# Patient Record
Sex: Female | Born: 1975 | Race: White | Hispanic: No | Marital: Married | Smoking: Never smoker
Health system: Southern US, Community
[De-identification: ages and names within clinical notes are randomized; demographics above are authoritative.]

## PROBLEM LIST (undated history)

## (undated) DIAGNOSIS — F909 Attention-deficit hyperactivity disorder, unspecified type: Secondary | ICD-10-CM

## (undated) DIAGNOSIS — I495 Sick sinus syndrome: Secondary | ICD-10-CM

## (undated) DIAGNOSIS — I4892 Unspecified atrial flutter: Secondary | ICD-10-CM

## (undated) DIAGNOSIS — B019 Varicella without complication: Secondary | ICD-10-CM

## (undated) DIAGNOSIS — I499 Cardiac arrhythmia, unspecified: Secondary | ICD-10-CM

## (undated) DIAGNOSIS — I2510 Atherosclerotic heart disease of native coronary artery without angina pectoris: Secondary | ICD-10-CM

## (undated) HISTORY — PX: ARTHROSCOPIC REPAIR ACL: SUR80

## (undated) HISTORY — DX: Unspecified atrial flutter: I48.92

## (undated) HISTORY — DX: Varicella without complication: B01.9

## (undated) HISTORY — DX: Attention-deficit hyperactivity disorder, unspecified type: F90.9

## (undated) HISTORY — DX: Cardiac arrhythmia, unspecified: I49.9

## (undated) HISTORY — DX: Sick sinus syndrome: I49.5

## (undated) HISTORY — PX: CARDIAC ELECTROPHYSIOLOGY STUDY AND ABLATION: SHX1294

## (undated) HISTORY — PX: INSERT / REPLACE / REMOVE PACEMAKER: SUR710

## (undated) HISTORY — PX: BILATERAL CARPAL TUNNEL RELEASE: SHX6508

## (undated) HISTORY — PX: LIPOMA EXCISION: SHX5283

---
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
## 1994-08-09 HISTORY — PX: ARTHROSCOPIC REPAIR ACL: SUR80

## 1999-02-07 DIAGNOSIS — Z950 Presence of cardiac pacemaker: Secondary | ICD-10-CM

## 1999-02-07 HISTORY — DX: Presence of cardiac pacemaker: Z95.0

## 1999-02-07 HISTORY — PX: INSERT / REPLACE / REMOVE PACEMAKER: SUR710

## 1999-08-10 HISTORY — PX: LIPOMA EXCISION: SHX5283

## 2002-08-09 HISTORY — PX: HYSTEROSCOPY: SHX211

## 2005-08-09 HISTORY — PX: BILATERAL CARPAL TUNNEL RELEASE: SHX6508

## 2018-03-06 DIAGNOSIS — M25562 Pain in left knee: Secondary | ICD-10-CM

## 2018-03-06 HISTORY — DX: Pain in left knee: M25.562

## 2020-06-30 ENCOUNTER — Telehealth: Payer: Self-pay | Admitting: Cardiology

## 2020-06-30 DIAGNOSIS — Z950 Presence of cardiac pacemaker: Secondary | ICD-10-CM

## 2020-06-30 DIAGNOSIS — I519 Heart disease, unspecified: Secondary | ICD-10-CM

## 2020-06-30 DIAGNOSIS — F909 Attention-deficit hyperactivity disorder, unspecified type: Secondary | ICD-10-CM | POA: Insufficient documentation

## 2020-06-30 HISTORY — DX: Presence of cardiac pacemaker: Z95.0

## 2020-06-30 HISTORY — DX: Heart disease, unspecified: I51.9

## 2020-06-30 NOTE — Telephone Encounter (Addendum)
   [facility] Pre-operative Risk Assessment    HEARTCARE STAFF: - Please ensure there is not already an duplicate clearance open for this procedure. - Under Visit Info/Reason for Call, type in Other and utilize the format Clearance MM/DD/YY or Clearance TBD. Do not use dashes or single digits. - If request is for dental extraction, please clarify the # of teeth to be extracted.  Request for surgical clearance:  1. What type of surgery is being performed? L total Knee Arthroplasty   2. When is this surgery scheduled? TBD   3. What type of clearance is required (medical clearance vs. Pharmacy clearance to hold med vs. Both)?  BOTH Also noted has st jude device   4. Are there any medications that need to be held prior to surgery and how long? Please advise   5. Practice name and name of physician performing surgery? [facility] Dr. Skip Estimable   6. What is the office phone number?  [phone]    7.   What is the office fax number? [phone]  8.   Anesthesia type (None, local, MAC, general) ?  Not noted    Clarisse Gouge 06/30/2020, 9:10 AM  _________________________________________________________________   (provider comments below)

## 2020-06-30 NOTE — Telephone Encounter (Signed)
Patient has not ever been seen by our office. She has a New Patient visit with Dr. Azucena Cecil tomorrow for pre-op evaluation. Therefore, will route clearance form to him so this is available at tomorrow's visit and will remove from pre-op pool.  Corrin Parker, PA-C 06/30/2020 10:57 AM

## 2020-07-01 ENCOUNTER — Ambulatory Visit (INDEPENDENT_AMBULATORY_CARE_PROVIDER_SITE_OTHER): Payer: 59 | Admitting: Cardiology

## 2020-07-01 ENCOUNTER — Encounter: Payer: Self-pay | Admitting: Cardiology

## 2020-07-01 ENCOUNTER — Other Ambulatory Visit: Payer: Self-pay

## 2020-07-01 VITALS — BP 108/76 | HR 70 | Ht 61.0 in | Wt 161.0 lb

## 2020-07-01 DIAGNOSIS — I471 Supraventricular tachycardia: Secondary | ICD-10-CM | POA: Diagnosis not present

## 2020-07-01 DIAGNOSIS — Z01818 Encounter for other preprocedural examination: Secondary | ICD-10-CM

## 2020-07-01 DIAGNOSIS — Z950 Presence of cardiac pacemaker: Secondary | ICD-10-CM | POA: Diagnosis not present

## 2020-07-01 MED ORDER — FLECAINIDE ACETATE 50 MG PO TABS: 50.0000 mg | ORAL_TABLET | Freq: Two times a day (BID) | ORAL | 5 refills | Status: DC

## 2020-07-01 MED ORDER — METOPROLOL SUCCINATE ER 25 MG PO TB24
25.0000 mg | ORAL_TABLET | Freq: Every day | ORAL | 5 refills | Status: DC
Start: 2020-07-01 — End: 2021-01-26

## 2020-07-01 NOTE — Progress Notes (Signed)
Cardiology Office Note:    Date:  07/01/2020   ID:  Emma Long, DOB 02-11-1976, MRN [id number]  PCP:  Patient, No Pcp Per  [facility] Cardiologist:  Debbe Odea, MD  [facility] Electrophysiologist:  None   Referring MD: No ref. provider found   Chief Complaint  Patient presents with  . New Patient (Initial Visit)    Establish care with provider for surgical clearance for total knee replacement. Medications verbally reviewed with patient.     History of Present Illness:    Emma Long is a 44 y.o. female with a hx of ADHD, atrial arrhythmias (parox SVT, afib s/p ablation 1997-1999), permanent pacemaker (st jude's 2000) who presents for preop eval.  Patient had ACL tear about 25 years ago. Underwent left knee surgery at the time. Subsequently developed left knee pain, steroid injections, gel injections have been tried without improvement in her symptoms. States having significant loss of cartilage with bone-on-bone contact. Left knee replacement is being planned.  She was diagnosed with SVT back in 1997. Antiarrhythmics were tried without improvement in symptoms. Subsequently underwent ablation procedure at [facility]. She developed recurrent atrial arrhythmias including atrial fibrillation and atrial flutter. She underwent 3 more ablation procedures between 97 and 99, started on flecainide and beta-blocker. She subsequently developed scar tissue in the atrium affecting her sinus node, leading to significant bradycardia. Permanent pacemaker was subsequently placed.  She is continue flecainide and beta-blocker sings, denies palpitations, denies chest pain or shortness of breath at rest or with exertion. Mobility is limited by her left knee pain. Denies any history of coronary artery disease.  Past Medical History:  Diagnosis Date  . ADHD   . Chicken pox   . Heart disease 06/30/2020  . Pacemaker 06/30/2020  . Pain in left knee 03/06/2018    Past Surgical History:   Procedure Laterality Date  . ARTHROSCOPIC REPAIR ACL     Left   . BILATERAL CARPAL TUNNEL RELEASE    . INSERT / REPLACE / REMOVE PACEMAKER    . LIPOMA EXCISION      Current Medications: Current Meds  Medication Sig  . amphetamine-dextroamphetamine (ADDERALL) 20 MG tablet 20 mg 2 (two) times daily  . ascorbic acid (VITAMIN C) 1000 MG tablet Take by mouth.  . ergocalciferol (VITAMIN D2) 1.25 MG (50000 UT) capsule ergocalciferol (vitamin D2) 1,250 mcg (50,000 unit) capsule  . flecainide (TAMBOCOR) 50 MG tablet Take 1 tablet (50 mg total) by mouth 2 (two) times daily.  Marland Kitchen lidocaine-prilocaine (EMLA) cream lidocaine-prilocaine 2.5 %-2.5 % topical cream  . metoprolol succinate (TOPROL-XL) 25 MG 24 hr tablet Take 1 tablet (25 mg total) by mouth daily.  Marland Kitchen VYVANSE 70 MG capsule Take 70 mg by mouth every morning.  . zinc gluconate 50 MG tablet Take 1 tablet by mouth daily.  . [DISCONTINUED] flecainide (TAMBOCOR) 50 MG tablet Take 50 mg by mouth 2 (two) times daily.  . [DISCONTINUED] metoprolol succinate (TOPROL-XL) 25 MG 24 hr tablet Take 25 mg by mouth daily.     Allergies:   Iodinated diagnostic agents and Other   Social History   Socioeconomic History  . Marital status: Married    Spouse name: Not on file  . Number of children: Not on file  . Years of education: Not on file  . Highest education level: Not on file  Occupational History  . Not on file  Tobacco Use  . Smoking status: Never Smoker  . Smokeless tobacco: Never Used  Substance and  Sexual Activity  . Alcohol use: Never  . Drug use: Never  . Sexual activity: Not on file  Other Topics Concern  . Not on file  Social History Narrative  . Not on file   Social Determinants of Health   Financial Resource Strain:   . Difficulty of Paying Living Expenses: Not on file  Food Insecurity:   . Worried About Programme researcher, broadcasting/film/video in the Last Year: Not on file  . Ran Out of Food in the Last Year: Not on file  Transportation  Needs:   . Lack of Transportation (Medical): Not on file  . Lack of Transportation (Non-Medical): Not on file  Physical Activity:   . Days of Exercise per Week: Not on file  . Minutes of Exercise per Session: Not on file  Stress:   . Feeling of Stress : Not on file  Social Connections:   . Frequency of Communication with Friends and Family: Not on file  . Frequency of Social Gatherings with Friends and Family: Not on file  . Attends Religious Services: Not on file  . Active Member of Clubs or Organizations: Not on file  . Attends Banker Meetings: Not on file  . Marital Status: Not on file     Family History: The patient's family history includes Breast cancer in her mother; Hypertension in her father and mother; Ovarian cysts in her sister; Parkinson's disease in her father; Prostate cancer in her father.  ROS:   Please see the history of present illness.     All other systems reviewed and are negative.  EKGs/Labs/Other Studies Reviewed:    The following studies were reviewed today:   EKG:  EKG is  ordered today.  The ekg ordered today demonstrates A- V paced rhythm  Recent Labs: No results found for requested labs within last 8760 hours.  Recent Lipid Panel No results found for: CHOL, TRIG, HDL, CHOLHDL, VLDL, LDLCALC, LDLDIRECT   Risk Assessment/Calculations:      Physical Exam:    VS:  BP 108/76 (BP Location: Right Arm, Patient Position: Sitting, Cuff Size: Normal)   Pulse 70   Ht 5\' 1"  (1.549 m)   Wt 161 lb (73 kg)   SpO2 99%   BMI 30.42 kg/m     Wt Readings from Last 3 Encounters:  07/01/20 161 lb (73 kg)     GEN:  Well nourished, well developed in no acute distress HEENT: Normal NECK: No JVD; No carotid bruits LYMPHATICS: No lymphadenopathy CARDIAC: RRR, no murmurs, rubs, gallops RESPIRATORY:  Clear to auscultation without rales, wheezing or rhonchi  ABDOMEN: Soft, non-tender, non-distended MUSCULOSKELETAL:  No edema; No deformity   SKIN: Warm and dry NEUROLOGIC:  Alert and oriented x 3 PSYCHIATRIC:  Normal affect   ASSESSMENT:    1. Pre-op evaluation   2. Pacemaker   3. Paroxysmal SVT (supraventricular tachycardia) (HCC)    PLAN:    In order of problems listed above:  1. Left knee replacement is being planned. Patient denies any symptoms of chest pain or shortness of breath. Has no history of CAD. Did tolerate a prior knee procedure. Will get echocardiogram to evaluate overall cardiac function. If echo is normal, patient can proceed with procedure at acceptable cardiac risk. 2. Permanent pacemaker, SA node dysfunction. Keep appointment with device clinic as scheduled. Will likely need [employer] device rep at time of procedure to program pacemaker to asynchronous mode. 3. History of atrial arrhythmia including A. fib, flutter, SVT status  post ablation. Continue Toprol-XL and flecainide  Follow-up in 1 year..    Shared Decision Making/Informed Consent       Medication Adjustments/Labs and Tests Ordered: Current medicines are reviewed at length with the patient today.  Concerns regarding medicines are outlined above.  Orders Placed This Encounter  Procedures  . EKG 12-Lead  . ECHOCARDIOGRAM COMPLETE   Meds ordered this encounter  Medications  . flecainide (TAMBOCOR) 50 MG tablet    Sig: Take 1 tablet (50 mg total) by mouth 2 (two) times daily.    Dispense:  60 tablet    Refill:  5  . metoprolol succinate (TOPROL-XL) 25 MG 24 hr tablet    Sig: Take 1 tablet (25 mg total) by mouth daily.    Dispense:  30 tablet    Refill:  5    Patient Instructions  Medication Instructions:   Your physician recommends that you continue on your current medications as directed. Please refer to the Current Medication list given to you today.  *If you need a refill on your cardiac medications before your next appointment, please call your pharmacy*   Lab Work: None Ordered If you have labs (blood work) drawn  today and your tests are completely normal, you will receive your results only by: Marland Kitchen MyChart Message (if you have MyChart) OR . A paper copy in the mail If you have any lab test that is abnormal or we need to change your treatment, we will call you to review the results.   Testing/Procedures:  1. Your physician has requested that you have an echocardiogram. Echocardiography is a painless test that uses sound waves to create images of your heart. It provides your doctor with information about the size and shape of your heart and how well your heart's chambers and valves are working. This procedure takes approximately one hour. There are no restrictions for this procedure.   Follow-Up: At [facility], you and your health needs are our priority.  As part of our continuing mission to provide you with exceptional heart care, we have created designated Provider Care Teams.  These Care Teams include your primary Cardiologist (physician) and Advanced Practice Providers (APPs -  Physician Assistants and Nurse Practitioners) who all work together to provide you with the care you need, when you need it.  We recommend signing up for the patient portal called "MyChart".  Sign up information is provided on this After Visit Summary.  MyChart is used to connect with patients for Virtual Visits (Telemedicine).  Patients are able to view lab/test results, encounter notes, upcoming appointments, etc.  Non-urgent messages can be sent to your provider as well.   To learn more about what you can do with MyChart, go to [email or web address].    Your next appointment:   1 year(s)  The format for your next appointment:   In Person  Provider:   Debbe Odea, MD   Other Instructions      Signed, Debbe Odea, MD  07/01/2020 10:25 AM    [facility]

## 2020-07-01 NOTE — Patient Instructions (Signed)
Medication Instructions:   Your physician recommends that you continue on your current medications as directed. Please refer to the Current Medication list given to you today.  *If you need a refill on your cardiac medications before your next appointment, please call your pharmacy*   Lab Work: None Ordered If you have labs (blood work) drawn today and your tests are completely normal, you will receive your results only by:  MyChart Message (if you have MyChart) OR  A paper copy in the mail If you have any lab test that is abnormal or we need to change your treatment, we will call you to review the results.   Testing/Procedures:  1. Your physician has requested that you have an echocardiogram. Echocardiography is a painless test that uses sound waves to create images of your heart. It provides your doctor with information about the size and shape of your heart and how well your hearts chambers and valves are working. This procedure takes approximately one hour. There are no restrictions for this procedure.   Follow-Up: At [facility], you and your health needs are our priority.  As part of our continuing mission to provide you with exceptional heart care, we have created designated Provider Care Teams.  These Care Teams include your primary Cardiologist (physician) and Advanced Practice Providers (APPs -  Physician Assistants and Nurse Practitioners) who all work together to provide you with the care you need, when you need it.  We recommend signing up for the patient portal called "MyChart".  Sign up information is provided on this After Visit Summary.  MyChart is used to connect with patients for Virtual Visits (Telemedicine).  Patients are able to view lab/test results, encounter notes, upcoming appointments, etc.  Non-urgent messages can be sent to your provider as well.   To learn more about what you can do with MyChart, go to [email or web address].    Your next appointment:    1 year(s)  The format for your next appointment:   In Person  Provider:   Debbe Odea, MD   Other Instructions

## 2020-07-07 ENCOUNTER — Encounter: Payer: Self-pay | Admitting: *Deleted

## 2020-07-08 ENCOUNTER — Other Ambulatory Visit: Payer: Self-pay

## 2020-07-08 ENCOUNTER — Ambulatory Visit
Admission: RE | Admit: 2020-07-08 | Discharge: 2020-07-08 | Disposition: A | Discharge status: Home / Self Care | Payer: 59 | Source: Ambulatory Visit | Attending: Cardiology | Admitting: Cardiology

## 2020-07-08 DIAGNOSIS — I110 Hypertensive heart disease with heart failure: Secondary | ICD-10-CM | POA: Diagnosis not present

## 2020-07-08 DIAGNOSIS — I071 Rheumatic tricuspid insufficiency: Secondary | ICD-10-CM | POA: Insufficient documentation

## 2020-07-08 DIAGNOSIS — Z950 Presence of cardiac pacemaker: Secondary | ICD-10-CM | POA: Insufficient documentation

## 2020-07-08 DIAGNOSIS — E785 Hyperlipidemia, unspecified: Secondary | ICD-10-CM | POA: Insufficient documentation

## 2020-07-08 DIAGNOSIS — E119 Type 2 diabetes mellitus without complications: Secondary | ICD-10-CM | POA: Insufficient documentation

## 2020-07-08 DIAGNOSIS — I509 Heart failure, unspecified: Secondary | ICD-10-CM | POA: Insufficient documentation

## 2020-07-08 DIAGNOSIS — I471 Supraventricular tachycardia: Secondary | ICD-10-CM | POA: Insufficient documentation

## 2020-07-08 LAB — ECHOCARDIOGRAM COMPLETE
AR max vel: 2.59 cm2
AV Area VTI: 2.58 cm2
AV Area mean vel: 2.58 cm2
AV Mean grad: 7 mmHg
AV Peak grad: 11.7 mmHg
Ao pk vel: 1.71 m/s
Area-P 1/2: 3.42 cm2
S' Lateral: 2.69 cm

## 2020-07-08 NOTE — Progress Notes (Signed)
*  PRELIMINARY RESULTS* Echocardiogram 2D Echocardiogram has been performed.  Emma Long Emma Long 07/08/2020, 10:42 AM

## 2020-07-15 ENCOUNTER — Telehealth: Payer: Self-pay | Admitting: Cardiology

## 2020-07-15 NOTE — Telephone Encounter (Addendum)
   Primary Cardiologist: Debbe Odea, MD  Chart reviewed and patient contacted today by phone as part of pre-operative protocol coverage. Pt seen by Dr Azucena Cecil 07/01/2020 for pre op clearance.  Echo done 07/08/2020  Showed normal LVF. She has had no new complaints.  Given past medical history and time since last visit, based on ACC/AHA guidelines, Emma Long would be at acceptable risk for the planned procedure without further cardiovascular testing.   The patient was advised that if she develops new symptoms prior to surgery to contact our office to arrange for a follow-up visit, and she verbalized understanding.  I will route this recommendation to the requesting party via Epic fax function and remove from pre-op pool.  Please call with questions.  Corine Shelter, PA-C 07/15/2020, 1:21 PM

## 2020-07-15 NOTE — Telephone Encounter (Signed)
   [facility] Pre-operative Risk Assessment    HEARTCARE STAFF: - Please ensure there is not already an duplicate clearance open for this procedure. - Under Visit Info/Reason for Call, type in Other and utilize the format Clearance MM/DD/YY or Clearance TBD. Do not use dashes or single digits. - If request is for dental extraction, please clarify the # of teeth to be extracted.  Request for surgical clearance:  1. What type of surgery is being performed? L total Knee Arthroplasty   2. When is this surgery scheduled? TBD   3. What type of clearance is required (medical clearance vs. Pharmacy clearance to hold med vs. Both)?  BOTH Also noted has st jude device   4. Are there any medications that need to be held prior to surgery and how long? Please advise   5. Practice name and name of physician performing surgery? [facility] Dr. Skip Estimable   6. What is the office phone number?  [phone]    7.   What is the office fax number? [phone]  8.   Anesthesia type (None, local, MAC, general) ?  Not noted    Marykay Lex 07/15/2020, 11:04 AM  _________________________________________________________________   (provider comments below)

## 2020-07-28 ENCOUNTER — Ambulatory Visit (INDEPENDENT_AMBULATORY_CARE_PROVIDER_SITE_OTHER): Payer: 59 | Admitting: Family Medicine

## 2020-07-28 ENCOUNTER — Encounter: Payer: Self-pay | Admitting: Family Medicine

## 2020-07-28 ENCOUNTER — Other Ambulatory Visit (HOSPITAL_COMMUNITY)
Admission: RE | Admit: 2020-07-28 | Discharge: 2020-07-28 | Disposition: A | Discharge status: Home / Self Care | Payer: 59 | Source: Ambulatory Visit | Attending: Family Medicine | Admitting: Family Medicine

## 2020-07-28 ENCOUNTER — Other Ambulatory Visit: Payer: Self-pay

## 2020-07-28 DIAGNOSIS — Z23 Encounter for immunization: Secondary | ICD-10-CM | POA: Diagnosis not present

## 2020-07-28 DIAGNOSIS — Z124 Encounter for screening for malignant neoplasm of cervix: Secondary | ICD-10-CM | POA: Diagnosis not present

## 2020-07-28 DIAGNOSIS — Z01419 Encounter for gynecological examination (general) (routine) without abnormal findings: Secondary | ICD-10-CM | POA: Diagnosis not present

## 2020-07-28 NOTE — Progress Notes (Signed)
Starting to get irregular periods

## 2020-07-28 NOTE — Progress Notes (Signed)
  Subjective:     Emma Long is a 44 y.o. female and is here for a comprehensive physical exam. The patient reports no problems. Her cycles have gotten somewhat irregular with her skipping a few months between them. Has long h/o hot flashes.    The following portions of the patient's history were reviewed and updated as appropriate: allergies, current medications, past family history, past medical history, past social history, past surgical history and problem list.  Review of Systems Pertinent items are noted in HPI.   Objective:    BP 125/80   Pulse 77   Ht 5\' 1"  (1.549 m)   Wt 164 lb (74.4 kg)   BMI 30.99 kg/m  General appearance: alert, cooperative and appears stated age Head: Normocephalic, without obvious abnormality, atraumatic Neck: no adenopathy, supple, symmetrical, trachea midline and thyroid not enlarged, symmetric, no tenderness/mass/nodules Lungs: clear to auscultation bilaterally Breasts: normal appearance, no masses or tenderness Heart: regular rate and rhythm, S1, S2 normal, no murmur, click, rub or gallop Abdomen: soft, non-tender; bowel sounds normal; no masses,  no organomegaly Pelvic: cervix normal in appearance, external genitalia normal, no adnexal masses or tenderness, no cervical motion tenderness, uterus normal size, shape, and consistency and vagina normal without discharge Extremities: extremities normal, atraumatic, no cyanosis or edema Pulses: 2+ and symmetric Skin: Skin color, texture, turgor normal. No rashes or lesions Lymph nodes: Cervical, supraclavicular, and axillary nodes normal. Neurologic: Grossly normal    Assessment:    Healthy female exam.      Plan:   Problem List Items Addressed This Visit   None   Visit Diagnoses    Screening for malignant neoplasm of cervix       Relevant Orders   Cytology - PAP   Encounter for gynecological examination without abnormal finding       Relevant Orders   MM 3D SCREEN BREAST BILATERAL    Tdap vaccine greater than or equal to 7yo IM (Completed)   Follicle stimulating hormone (Completed)   TSH (Completed)   Hemoglobin A1c (Completed)   CBC (Completed)   Comprehensive metabolic panel (Completed)   VITAMIN D 25 Hydroxy (Vit-D Deficiency, Fractures) (Completed)   Lipid panel (Completed)     Return in 1 year (on 07/28/2021).    See After Visit Summary for Counseling Recommendations

## 2020-07-29 ENCOUNTER — Telehealth: Payer: Self-pay | Admitting: *Deleted

## 2020-07-29 LAB — COMPREHENSIVE METABOLIC PANEL
ALT: 22 IU/L (ref 0–32)
AST: 18 IU/L (ref 0–40)
Albumin/Globulin Ratio: 2 (ref 1.2–2.2)
Albumin: 4.7 g/dL (ref 3.8–4.8)
Alkaline Phosphatase: 75 IU/L (ref 44–121)
BUN/Creatinine Ratio: 20 (ref 9–23)
BUN: 17 mg/dL (ref 6–24)
Bilirubin Total: 0.5 mg/dL (ref 0.0–1.2)
CO2: 23 mmol/L (ref 20–29)
Calcium: 10 mg/dL (ref 8.7–10.2)
Chloride: 101 mmol/L (ref 96–106)
Creatinine, Ser: 0.86 mg/dL (ref 0.57–1.00)
GFR calc Af Amer: 94 mL/min/{1.73_m2} (ref >59)
GFR calc non Af Amer: 81 mL/min/{1.73_m2} (ref >59)
Globulin, Total: 2.4 g/dL (ref 1.5–4.5)
Glucose: 98 mg/dL (ref 65–99)
Potassium: 4.3 mmol/L (ref 3.5–5.2)
Sodium: 140 mmol/L (ref 134–144)
Total Protein: 7.1 g/dL (ref 6.0–8.5)

## 2020-07-29 LAB — CBC
Hematocrit: 44.4 % (ref 34.0–46.6)
Hemoglobin: 14.5 g/dL (ref 11.1–15.9)
MCH: 30 pg (ref 26.6–33.0)
MCHC: 32.7 g/dL (ref 31.5–35.7)
MCV: 92 fL (ref 79–97)
Platelets: 279 10*3/uL (ref 150–450)
RBC: 4.83 x10E6/uL (ref 3.77–5.28)
RDW: 12.2 % (ref 11.7–15.4)
WBC: 6 10*3/uL (ref 3.4–10.8)

## 2020-07-29 LAB — LIPID PANEL
Chol/HDL Ratio: 3.4 ratio (ref 0.0–4.4)
Cholesterol, Total: 245 mg/dL — ABNORMAL HIGH (ref 100–199)
HDL: 73 mg/dL (ref >39)
LDL Chol Calc (NIH): 145 mg/dL — ABNORMAL HIGH (ref 0–99)
Triglycerides: 155 mg/dL — ABNORMAL HIGH (ref 0–149)
VLDL Cholesterol Cal: 27 mg/dL (ref 5–40)

## 2020-07-29 LAB — FOLLICLE STIMULATING HORMONE: FSH: 34.6 m[IU]/mL

## 2020-07-29 LAB — TSH: TSH: 1.45 u[IU]/mL (ref 0.450–4.500)

## 2020-07-29 LAB — HEMOGLOBIN A1C
Est. average glucose Bld gHb Est-mCnc: 123 mg/dL
Hgb A1c MFr Bld: 5.9 % — ABNORMAL HIGH (ref 4.8–5.6)

## 2020-07-29 LAB — VITAMIN D 25 HYDROXY (VIT D DEFICIENCY, FRACTURES): Vit D, 25-Hydroxy: 43.4 ng/mL (ref 30.0–100.0)

## 2020-07-29 NOTE — Telephone Encounter (Signed)
-----   Message from Reva Bores, MD sent at 07/29/2020  9:03 AM EST ----- Has pre-diabetes, and elevated cholesterol. Other labs look good--my guess is she is getting near menopause, based on the lab finding of FSH 34--usually in the single digits if not in menopause and > 50 is diagnostic of menopause.

## 2020-07-29 NOTE — Telephone Encounter (Signed)
Pt informed of lab results. 

## 2020-07-30 LAB — CYTOLOGY - PAP
Adequacy: ABSENT
Comment: NEGATIVE
Diagnosis: NEGATIVE
Diagnosis: REACTIVE
High risk HPV: NEGATIVE

## 2020-08-25 ENCOUNTER — Telehealth: Payer: Self-pay | Admitting: Internal Medicine

## 2020-08-25 NOTE — Telephone Encounter (Signed)
  Patient Consent for Virtual Visit         Emma Long has provided verbal consent on 08/25/2020 for a virtual visit (video or telephone).   CONSENT FOR VIRTUAL VISIT FOR:  Emma Long  By participating in this virtual visit I agree to the following:  I hereby voluntarily request, consent and authorize [facility] and its employed or contracted physicians, physician assistants, nurse practitioners or other licensed health care professionals (the Practitioner), to provide me with telemedicine health care services (the "Services") as deemed necessary by the treating Practitioner. I acknowledge and consent to receive the Services by the Practitioner via telemedicine. I understand that the telemedicine visit will involve communicating with the Practitioner through live audiovisual communication technology and the disclosure of certain medical information by electronic transmission. I acknowledge that I have been given the opportunity to request an in-person assessment or other available alternative prior to the telemedicine visit and am voluntarily participating in the telemedicine visit.  I understand that I have the right to withhold or withdraw my consent to the use of telemedicine in the course of my care at any time, without affecting my right to future care or treatment, and that the Practitioner or I may terminate the telemedicine visit at any time. I understand that I have the right to inspect all information obtained and/or recorded in the course of the telemedicine visit and may receive copies of available information for a reasonable fee.  I understand that some of the potential risks of receiving the Services via telemedicine include:  Marland Kitchen Delay or interruption in medical evaluation due to technological equipment failure or disruption; . Information transmitted may not be sufficient (e.g. poor resolution of images) to allow for appropriate medical decision making by the Practitioner;  and/or  . In rare instances, security protocols could fail, causing a breach of personal health information.  Furthermore, I acknowledge that it is my responsibility to provide information about my medical history, conditions and care that is complete and accurate to the best of my ability. I acknowledge that Practitioner's advice, recommendations, and/or decision may be based on factors not within their control, such as incomplete or inaccurate data provided by me or distortions of diagnostic images or specimens that may result from electronic transmissions. I understand that the practice of medicine is not an exact science and that Practitioner makes no warranties or guarantees regarding treatment outcomes. I acknowledge that a copy of this consent can be made available to me via my patient portal [facility] MyChart), or I can request a printed copy by calling the office of [facility].    I understand that my insurance will be billed for this visit.   I have read or had this consent read to me. . I understand the contents of this consent, which adequately explains the benefits and risks of the Services being provided via telemedicine.  . I have been provided ample opportunity to ask questions regarding this consent and the Services and have had my questions answered to my satisfaction. . I give my informed consent for the services to be provided through the use of telemedicine in my medical care

## 2020-08-26 ENCOUNTER — Telehealth (INDEPENDENT_AMBULATORY_CARE_PROVIDER_SITE_OTHER): Payer: 59 | Admitting: Internal Medicine

## 2020-08-26 ENCOUNTER — Other Ambulatory Visit: Payer: Self-pay

## 2020-08-26 DIAGNOSIS — I499 Cardiac arrhythmia, unspecified: Secondary | ICD-10-CM

## 2020-08-26 DIAGNOSIS — I442 Atrioventricular block, complete: Secondary | ICD-10-CM | POA: Diagnosis not present

## 2020-08-26 DIAGNOSIS — I495 Sick sinus syndrome: Secondary | ICD-10-CM | POA: Diagnosis not present

## 2020-08-26 DIAGNOSIS — Z950 Presence of cardiac pacemaker: Secondary | ICD-10-CM | POA: Diagnosis not present

## 2020-08-26 NOTE — Patient Instructions (Signed)
Medication Instructions:  - Your physician recommends that you continue on your current medications as directed. Please refer to the Current Medication list given to you today.  *If you need a refill on your cardiac medications before your next appointment, please call your pharmacy*   Lab Work: - none ordered  If you have labs (blood work) drawn today and your tests are completely normal, you will receive your results only by: Marland Kitchen MyChart Message (if you have MyChart) OR . A paper copy in the mail If you have any lab test that is abnormal or we need to change your treatment, we will call you to review the results.   Testing/Procedures: - none ordered   Follow-Up: At [facility], you and your health needs are our priority.  As part of our continuing mission to provide you with exceptional heart care, we have created designated Provider Care Teams.  These Care Teams include your primary Cardiologist (physician) and Advanced Practice Providers (APPs -  Physician Assistants and Nurse Practitioners) who all work together to provide you with the care you need, when you need it.  We recommend signing up for the patient portal called "MyChart".  Sign up information is provided on this After Visit Summary.  MyChart is used to connect with patients for Virtual Visits (Telemedicine).  Patients are able to view lab/test results, encounter notes, upcoming appointments, etc.  Non-urgent messages can be sent to your provider as well.   To learn more about what you can do with MyChart, go to [email or web address].    Your next appointment:   1-2  month(s) (on a Device day)   The format for your next appointment:   In Person  Provider:   Sherryl Manges, MD   Other Instructions n/a

## 2020-08-26 NOTE — Progress Notes (Signed)
Electrophysiology TeleHealth Note   Due to national recommendations of social distancing due to COVID 19, an audio/video telehealth visit is felt to be most appropriate for this patient at this time.  See MyChart message from today for the patient's consent to telehealth for [facility].   Date:  08/26/2020   ID:  Emma Long, DOB 07-Feb-1976, MRN [id number]  Location: patient's home  Provider location: [address], [place]  Evaluation Performed: Follow-up visit  PCP:  Patient, No Pcp Per  Cardiologist:    BAE Electrophysiologist:  SK   Chief Complaint:  Pacemaker and atrial arrhythmias   History of Present Illness:    Emma Long is a 45 y.o. female who presents via audio/video conferencing for a telehealth visit today.  She is seen today in consultation for Dr Corrin Parker to establish pacemaker followup   Hx of SVT for which she underwent a series of ablation procedures  at [facility] in the late 90s for recurrent atrial arrhythmias, rx ultimately w flecainide.  Paced for sinus node dysfunction Medtronic  2000, gen change 2007 and then again 2015. 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  There have been no  lightheadedness  or syncope . Infrequent and brief palpitations.  Husband is Curator for [employer], 20 yo twins, Romeo Apple and Baird Lyons ( girl)   DATE TEST EF   11/21 Echo  60-65%         Date Cr K Hgb  12/21 0.86 4.3 14.5   DATE PR interval QRSduration Dose  11/21 AVp AVp 50          Symptoms assoc with missed metoprolol but not with missed flecainide   The patient denies symptoms of fevers, chills, cough, or new SOB worrisome for COVID 19.    Past Medical History:  Diagnosis Date  . ADHD   . Arrhythmia   . Asthma   . Atrial flutter (HCC)   . Bronchitis   . Chicken pox   . Chronic  kidney disease   . Coronary artery disease   . Diabetes mellitus without complication (HCC)   . Heart disease 06/30/2020  . Hepatitis   . Hypertension   . Pacemaker 06/30/2020  . Pain in left knee 03/06/2018  . Seizures (HCC)   . Sinoatrial node dysfunction (HCC)   . Thyroid disease     Past Surgical History:  Procedure Laterality Date  . ARTHROSCOPIC REPAIR ACL     Left   . BILATERAL CARPAL TUNNEL RELEASE    . CARDIAC ELECTROPHYSIOLOGY STUDY AND ABLATION    . INSERT / REPLACE / REMOVE PACEMAKER    . LIPOMA EXCISION    . TONSILLECTOMY AND ADENOIDECTOMY      Current Outpatient Medications  Medication Sig Dispense Refill  . amphetamine-dextroamphetamine (ADDERALL) 20 MG tablet 20 mg 2 (two) times daily    . ascorbic acid (VITAMIN C) 1000 MG tablet Take by mouth.    . flecainide (TAMBOCOR) 50 MG tablet Take 1 tablet (50 mg total) by mouth 2 (two) times daily. 60 tablet 5  . lidocaine-prilocaine (EMLA) cream lidocaine-prilocaine 2.5 %-2.5 % topical cream    . metoprolol succinate (TOPROL-XL) 25 MG 24 hr tablet Take 1 tablet (25 mg total) by mouth daily. 30 tablet 5  . VYVANSE 70 MG capsule Take  70 mg by mouth every morning.    . zinc gluconate 50 MG tablet Take 1 tablet by mouth daily.     No current facility-administered medications for this visit.    Allergies:   Iodinated diagnostic agents, Other, and Wound dressing adhesive   Social History:  The patient  reports that she has never smoked. She has never used smokeless tobacco. She reports that she does not drink alcohol and does not use drugs.   Family History:  The patient's   family history includes Breast cancer in her mother; Hypertension in her father and mother; Ovarian cysts in her sister; Parkinson's disease in her father; Prostate cancer in her father.   ROS:  Please see the history of present illness.   All other systems are personally reviewed and negative.    Exam:    Vital Signs:  Pulse 78   Ht 5\' 1"   (1.549 m)   Wt 163 lb (73.9 kg)   BMI 30.80 kg/m     Labs/Other Tests and Data Reviewed:    Recent Labs: 07/28/2020: ALT 22; BUN 17; Creatinine, Ser 0.86; Hemoglobin 14.5; Platelets 279; Potassium 4.3; Sodium 140; TSH 1.450   Wt Readings from Last 3 Encounters:  08/26/20 163 lb (73.9 kg)  07/28/20 164 lb (74.4 kg)  07/01/20 161 lb (73 kg)     Other studies personally reviewed: Additional studies/ records that were reviewed today include:   Device intertrogation from 4/21 was reviewed  A threshold 1-1.5@ 0.6 msec  AutoCapture  ASSESSMENT & PLAN:    Sinus Node Dysfunction  Complete Heart Block  Pacemaker St Jude   Atrial arrhythmia  ADD/ADHD     Continue flecainide--has symptomatic atrial tachycardia and continue metoprolol   Not sure why she developed complete heart block; possibilities include RF injury, more extensive original insult, ie whatever affected her atrium to cause all of her arrhythmias also affected her AV node, ? Sarcoid   Will consider at office followup Will see in the office to establish remote followup which she had declined previously as the prior office was doing it monthly in conjunction with semiannual office visits.      COVID 19 screen The patient denies symptoms of COVID 19 at this time.  The importance of social distancing was discussed today.  Follow-up: 1-2 months for office to establish device followup     Current medicines are reviewed at length with the patient today.   The patient does not have concerns regarding her medicines.  The following changes were made today:  none  Labs/ tests ordered today include:   No orders of the defined types were placed in this encounter.   Future tests ( post COVID )     Patient Risk:  after full review of this patients clinical status, I feel that they are at moderate  risk at this time.  Today, I have spent 32* minutes with the patient with telehealth technology discussing the  above.  Signed, 5/21, MD  08/26/2020 11:05 AM     [facility] [address] [place] [phone] (office) [phone] (fax)

## 2020-08-28 ENCOUNTER — Ambulatory Visit: Payer: Self-pay | Admitting: Family Medicine

## 2020-09-05 ENCOUNTER — Encounter: Payer: Self-pay | Admitting: Internal Medicine

## 2020-09-05 ENCOUNTER — Encounter
Admission: RE | Admit: 2020-09-05 | Discharge: 2020-09-05 | Disposition: A | Discharge status: Home / Self Care | Payer: 59 | Source: Ambulatory Visit | Attending: Orthopedic Surgery | Admitting: Orthopedic Surgery

## 2020-09-05 ENCOUNTER — Other Ambulatory Visit: Payer: Self-pay

## 2020-09-05 DIAGNOSIS — Z01818 Encounter for other preprocedural examination: Secondary | ICD-10-CM | POA: Insufficient documentation

## 2020-09-05 LAB — URINALYSIS, ROUTINE W REFLEX MICROSCOPIC
Bacteria, UA: NONE SEEN
Bilirubin Urine: NEGATIVE
Glucose, UA: NEGATIVE mg/dL
Ketones, ur: NEGATIVE mg/dL
Leukocytes,Ua: NEGATIVE
Nitrite: NEGATIVE
Protein, ur: NEGATIVE mg/dL
Specific Gravity, Urine: 1.011 (ref 1.005–1.030)
WBC, UA: NONE SEEN WBC/hpf (ref 0–5)
pH: 5 (ref 5.0–8.0)

## 2020-09-05 LAB — TYPE AND SCREEN
ABO/RH(D): O POS
Antibody Screen: NEGATIVE

## 2020-09-05 LAB — COMPREHENSIVE METABOLIC PANEL
ALT: 19 U/L (ref 0–44)
AST: 17 U/L (ref 15–41)
Albumin: 4 g/dL (ref 3.5–5.0)
Alkaline Phosphatase: 51 U/L (ref 38–126)
Anion gap: 9 (ref 5–15)
BUN: 28 mg/dL — ABNORMAL HIGH (ref 6–20)
CO2: 26 mmol/L (ref 22–32)
Calcium: 9.2 mg/dL (ref 8.9–10.3)
Chloride: 105 mmol/L (ref 98–111)
Creatinine, Ser: 0.71 mg/dL (ref 0.44–1.00)
GFR, Estimated: >60 mL/min (ref >60)
Glucose, Bld: 95 mg/dL (ref 70–99)
Potassium: 3.9 mmol/L (ref 3.5–5.1)
Sodium: 140 mmol/L (ref 135–145)
Total Bilirubin: 0.7 mg/dL (ref 0.3–1.2)
Total Protein: 7 g/dL (ref 6.5–8.1)

## 2020-09-05 LAB — CBC
HCT: 37.1 % (ref 36.0–46.0)
Hemoglobin: 12.5 g/dL (ref 12.0–15.0)
MCH: 30.6 pg (ref 26.0–34.0)
MCHC: 33.7 g/dL (ref 30.0–36.0)
MCV: 90.7 fL (ref 80.0–100.0)
Platelets: 299 10*3/uL (ref 150–400)
RBC: 4.09 MIL/uL (ref 3.87–5.11)
RDW: 11.7 % (ref 11.5–15.5)
WBC: 7 10*3/uL (ref 4.0–10.5)
nRBC: 0 % (ref 0.0–0.2)

## 2020-09-05 LAB — PROTIME-INR
INR: 1 (ref 0.8–1.2)
Prothrombin Time: 13.1 seconds (ref 11.4–15.2)

## 2020-09-05 LAB — C-REACTIVE PROTEIN: CRP: 0.6 mg/dL (ref <1.0)

## 2020-09-05 LAB — SEDIMENTATION RATE: Sed Rate: 8 mm/hr (ref 0–20)

## 2020-09-05 LAB — APTT: aPTT: 29 seconds (ref 24–36)

## 2020-09-05 LAB — SURGICAL PCR SCREEN
MRSA, PCR: NEGATIVE
Staphylococcus aureus: POSITIVE — AB

## 2020-09-05 NOTE — Patient Instructions (Addendum)
Your procedure is scheduled on: Wednesday, February 2 Report to the Registration Desk on the 1st floor of the [facility] To find out your arrival time, please call [phone] between 1PM - 3PM on: Tuesday, February 1  REMEMBER: Instructions that are not followed completely may result in serious medical risk, up to and including death; or upon the discretion of your surgeon and anesthesiologist your surgery may need to be rescheduled.  Do not eat food after midnight the night before surgery.  No gum chewing, lozengers or hard candies.  You may however, drink CLEAR liquids up to 2 hours before you are scheduled to arrive for your surgery. Do not drink anything within 2 hours of your scheduled arrival time.  Clear liquids include: - water  - apple juice without pulp - gatorade (not RED, PURPLE, OR BLUE) - black coffee or tea (Do NOT add milk or creamers to the coffee or tea) Do NOT drink anything that is not on this list.  In addition, your doctor has ordered for you to drink the provided  Ensure Pre-Surgery Clear Carbohydrate Drink  Drinking this carbohydrate drink up to two hours before surgery helps to reduce insulin resistance and improve patient outcomes. Please complete drinking 2 hours prior to scheduled arrival time.  TAKE THESE MEDICATIONS THE MORNING OF SURGERY WITH A SIP OF WATER:  1.  Flecainide 2.  Metoprolol  One week prior to surgery: starting January 26 Stop Anti-inflammatories (NSAIDS) such as Advil, Aleve, Ibuprofen, Motrin, Naproxen, Naprosyn and Aspirin based products such as Excedrin, Goodys Powder, BC Powder. Stop ANY OVER THE COUNTER supplements until after surgery. Stop vitamin C, Zinc (However, you may continue taking Vitamin D up until the day before surgery.)  No Alcohol for 24 hours before or after surgery.  On the morning of surgery brush your teeth with toothpaste and water, you may rinse your mouth with mouthwash if you wish. Do not swallow any  toothpaste or mouthwash.  Do not wear jewelry, make-up, hairpins, clips or nail polish.  Do not wear lotions, powders, or perfumes.   Do not shave body from the neck down 48 hours prior to surgery just in case you cut yourself which could leave a site for infection.  Also, freshly shaved skin may become irritated if using the CHG soap.  Do not bring valuables to the hospital. [facility] is not responsible for any missing/lost belongings or valuables.   Use CHG Soap as directed on instruction sheet.  Notify your doctor if there is any change in your medical condition (cold, fever, infection).  Wear comfortable clothing (specific to your surgery type) to the hospital.  Plan for stool softeners for home use; pain medications have a tendency to cause constipation. You can also help prevent constipation by eating foods high in fiber such as fruits and vegetables and drinking plenty of fluids as your diet allows.  After surgery, you can help prevent lung complications by doing breathing exercises.  Take deep breaths and cough every 1-2 hours. Your doctor may order a device called an Incentive Spirometer to help you take deep breaths.  If you are being discharged the day of surgery, you will not be allowed to drive home. You will need a responsible adult (18 years or older) to drive you home and stay with you that night.   If you are taking public transportation, you will need to have a responsible adult (18 years or older) with you. Please confirm with your physician that  it is acceptable to use public transportation.   Please call the Pre-admissions Testing Dept. at [phone] if you have any questions about these instructions.  Visitation Policy:  Patients undergoing a surgery or procedure may have one family member or support person with them as long as that person is not COVID-19 positive or experiencing its symptoms.  That person may remain in the waiting area during the  procedure.

## 2020-09-05 NOTE — Progress Notes (Unsigned)
PERIOPERATIVE PRESCRIPTION FOR IMPLANTED CARDIAC DEVICE PROGRAMMING  Patient Information: Name:  Veronia Laprise  DOB:  02-28-1976  MRN:  [id number]  {TIP - You do not have to delete this tip  -  Copy the info from the staff message sent by the PAT staff  then press F2 here and paste the information using CTL - V on the next line :[id number]}  Planned Procedure: COMPUTER ASSISTED TOTAL KNEE ARTHROPLASTY  Surgeon: Dr. Francesco Sor, MD  Date of Procedure: 09/10/2020  Cautery will be used.   Please send documentation back to:  [facility] [phone] # [phone])   Device Information:  Clinic EP Physician:  Sherryl Manges, MD   Device Type:  Pacemaker Manufacturer and Phone #:  St. Jude/Abbott: [phone] Pacemaker Dependent?:  Yes.   Date of Last Device Check:  Unknown Normal Device Function?:  Unknown  Electrophysiologist's Recommendations:   Have magnet available.  Provide continuous ECG monitoring when magnet is used or reprogramming is to be performed.   Device will need to be checked by industry.  Per Device Clinic Standing Orders, Lenor Coffin, RN  5:39 PM 09/05/2020

## 2020-09-07 LAB — URINE CULTURE
Culture: NO GROWTH
Special Requests: NORMAL

## 2020-09-08 ENCOUNTER — Other Ambulatory Visit: Payer: Self-pay

## 2020-09-08 ENCOUNTER — Ambulatory Visit: Payer: 59

## 2020-09-08 ENCOUNTER — Other Ambulatory Visit
Admission: RE | Admit: 2020-09-08 | Discharge: 2020-09-08 | Disposition: A | Discharge status: Home / Self Care | Payer: 59 | Source: Ambulatory Visit | Attending: Orthopedic Surgery | Admitting: Orthopedic Surgery

## 2020-09-08 ENCOUNTER — Telehealth: Payer: Self-pay

## 2020-09-08 DIAGNOSIS — Z01812 Encounter for preprocedural laboratory examination: Secondary | ICD-10-CM | POA: Diagnosis present

## 2020-09-08 DIAGNOSIS — U071 COVID-19: Secondary | ICD-10-CM | POA: Insufficient documentation

## 2020-09-08 LAB — SARS CORONAVIRUS 2 (TAT 6-24 HRS): SARS Coronavirus 2: POSITIVE — AB

## 2020-09-08 NOTE — Progress Notes (Signed)
[facility] Perioperative Services  Pre-Admission/Anesthesia Testing Clinical Review  Date: 09/08/20  Patient Demographics:  Name: Emma Long DOB:   [birth year] MRN:   [id number]  Planned Surgical Procedure(s):    Case: [id number] Date/Time: 09/10/20 1227   Procedure: COMPUTER ASSISTED TOTAL KNEE ARTHROPLASTY (Left Knee)   Anesthesia type: Choice   Pre-op diagnosis: Osteoarthritis   Location: [facility] OR ROOM 01 / [facility] ORS FOR ANESTHESIA GROUP   Surgeons: Donato Heinz, MD    NOTE: Available PAT nursing documentation and vital signs have been reviewed. Clinical nursing staff has updated patient's PMH/PSHx, current medication list, and drug allergies/intolerances to ensure comprehensive history available to assist in medical decision making as it pertains to the aforementioned surgical procedure and anticipated anesthetic course.   Clinical Discussion:  Emma Long is a 45 y.o. female who is submitted for pre-surgical anesthesia review and clearance prior to her undergoing the above procedure. Patient has never been a smoker. Pertinent PMH includes: atrial fibrillation (s/p ablation), sinoatrial node dysfunction (s/p PPM placement), ADHD.  Patient was seen and preoperative consult by cardiology Azucena Cecil, MD) on 07/01/2020; notes reviewed.  At the time of her clinic visit, patient noted to be doing well overall from a cardiovascular perspective. She denied chest pain, shortness of breath, orthopnea, PND, palpitations, peripheral edema, vertiginous symptoms, and presyncope/syncope.  Patient with PMH significant for paroxysmal SVT dating back to 1.  Oral antiarrhythmics were ineffective.  Patient ultimately required cardiac ablation.  Following her initial ablation, patient developed atrial arrhythmias requiring an additional 3 cardiac ablation procedures. 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).  Patient has been doing well since her pacemaker was placed.  Patient remains on flecainide and metoprolol for rate control.  Blood pressure well controlled at 108/76 in clinic. Functional capacity, as defined by DASI, is documented as being >/= 4 METS.  Her only limitation is related to her orthopedic pain.  Given that there is no recent imaging on file from patient, preoperative TTE was recommended.  No changes were made to patient medication regimen.  Patient to follow-up with outpatient cardiology in 1 year or sooner if needed.   Preoperative TTE performed on 07/08/2020.  Study demonstrated normal left ventricular systolic function with an EF of 60-65%.  There was mild LVH noted with no evidence of valvular insufficiency.  Patient is scheduled to undergo an elective orthopedic procedure on 09/10/2020 with Dr. Francesco Sor.  Given patient's past medical history significant for cardiovascular diagnoses, presurgical cardiac clearance was sought by the attending surgeons office and PAT team.  Per cardiology, preoperative echo normal.  Patient has no new complaints since previous visit with cardiologist on 07/01/2020, therefore "based on ACC/AHA guidelines, patient would be at an overall ACCEPTABLE risk for the planned procedure without further cardiovascular testing". This patient is not on any type of anticoagulation/antiplatelet therapy.  She denies previous perioperative complications with anesthesia.  Again, patient has a PPM in place.  Cardiology recommending that [facility] cardiac device representative be present at time of procedure to program pacemaker to asynchronous mode.  Device representative was notified by PAT team on 09/08/2020.  Vitals with BMI 09/05/2020 08/26/2020 07/28/2020  Height 5\' 1"  5\' 1"  5\' 1"   Weight 165 lbs 163 lbs 164 lbs  BMI 31.19 30.81 31  Systolic 129 - 125  Diastolic 81 - 80  Pulse 70 78 77    Providers/Specialists:   NOTE:  Primary  physician provider listed below. Patient may have been seen by APP or partner within same practice.   PROVIDER ROLE / SPECIALTY LAST OV  Hooten, Illene Labrador, MD Orthopedics (Surgeon)  06/26/2020  Patient, No Pcp Per Primary Care Provider  ???  Debbe Odea, MD Cardiology  07/01/2020   Allergies:  Iodinated diagnostic agents, Other, Tape, and Wound dressing adhesive  Current Home Medications:   No current facility-administered medications for this encounter.   Marland Kitchen amphetamine-dextroamphetamine (ADDERALL) 20 MG tablet  . ascorbic acid (VITAMIN C) 1000 MG tablet  . cholecalciferol (VITAMIN D) 25 MCG (1000 UNIT) tablet  . diclofenac Sodium (VOLTAREN) 1 % GEL  . flecainide (TAMBOCOR) 50 MG tablet  . lidocaine-prilocaine (EMLA) cream  . metoprolol succinate (TOPROL-XL) 25 MG 24 hr tablet  . VYVANSE 70 MG capsule  . Zinc 50 MG TABS   History:   Past Medical History:  Diagnosis Date  . ADHD   . Arrhythmia   . Atrial flutter (HCC)   . Chicken pox   . Heart disease 06/30/2020  . Pacemaker 02/1999  . Pain in left knee 03/06/2018  . Sinoatrial node dysfunction (HCC)    Past Surgical History:  Procedure Laterality Date  . ARTHROSCOPIC REPAIR ACL Left 1996  . BILATERAL CARPAL TUNNEL RELEASE Bilateral 2007  . CARDIAC ELECTROPHYSIOLOGY STUDY AND ABLATION  1996-1999   x4  . HYSTEROSCOPY  2004   essure placement  . INSERT / REPLACE / REMOVE PACEMAKER  02/1999   SA node blocked from scar tissue formed after 4th ablation  . LIPOMA EXCISION  2001   left scapula  . TONSILLECTOMY AND ADENOIDECTOMY  1992   Family History  Problem Relation Age of Onset  . Breast cancer Mother   . Hypertension Mother   . Hypertension Father   . Parkinson's disease Father   . Prostate cancer Father   . Ovarian cysts Sister    Social History   Tobacco Use  . Smoking status: Never Smoker  . Smokeless tobacco: Never Used  Vaping Use  . Vaping Use: Never used  Substance Use Topics  . Alcohol use:  Not Currently    Comment: rarely  . Drug use: Never    Pertinent Clinical Results:  LABS: Labs reviewed: Acceptable for surgery.  No visits with results within 3 Day(s) from this visit.  Latest known visit with results is:  Hospital Outpatient Visit on 09/05/2020  Component Date Value Ref Range Status  . WBC 09/05/2020 7.0  4.0 - 10.5 K/uL Final  . RBC 09/05/2020 4.09  3.87 - 5.11 MIL/uL Final  . Hemoglobin 09/05/2020 12.5  12.0 - 15.0 g/dL Final  . HCT 40/98/1191 37.1  36.0 - 46.0 % Final  . MCV 09/05/2020 90.7  80.0 - 100.0 fL Final  . MCH 09/05/2020 30.6  26.0 - 34.0 pg Final  . MCHC 09/05/2020 33.7  30.0 - 36.0 g/dL Final  . RDW 47/82/9562 11.7  11.5 - 15.5 % Final  . Platelets 09/05/2020 299  150 - 400 K/uL Final  . nRBC 09/05/2020 0.0  0.0 - 0.2 % Final   Performed at [facility], [address], [place], [place] [address]  . Sodium 09/05/2020 140  135 - 145 mmol/L Final  . Potassium 09/05/2020 3.9  3.5 - 5.1 mmol/L Final  . Chloride 09/05/2020 105  98 - 111 mmol/L Final  . CO2 09/05/2020 26  22 - 32 mmol/L Final  . Glucose, Bld 09/05/2020 95  70 - 99  mg/dL Final   Glucose reference range applies only to samples taken after fasting for at least 8 hours.  . BUN 09/05/2020 28* 6 - 20 mg/dL Final  . Creatinine, Ser 09/05/2020 0.71  0.44 - 1.00 mg/dL Final  . Calcium 71/25/2712 9.2  8.9 - 10.3 mg/dL Final  . Total Protein 09/05/2020 7.0  6.5 - 8.1 g/dL Final  . Albumin 92/90/9030 4.0  3.5 - 5.0 g/dL Final  . AST 14/99/6924 17  15 - 41 U/L Final  . ALT 09/05/2020 19  0 - 44 U/L Final  . Alkaline Phosphatase 09/05/2020 51  38 - 126 U/L Final  . Total Bilirubin 09/05/2020 0.7  0.3 - 1.2 mg/dL Final  . GFR, Estimated 09/05/2020 >60  >60 mL/min Final   Comment: (NOTE) Calculated using the CKD-EPI Creatinine Equation (2021)   . Anion gap 09/05/2020 9  5 - 15 Final   Performed at [facility], [address] [place] [place], [place] [address]  . Prothrombin Time  09/05/2020 13.1  11.4 - 15.2 seconds Final  . INR 09/05/2020 1.0  0.8 - 1.2 Final   Comment: (NOTE) INR goal varies based on device and disease states. Performed at [facility], [address] [place], [place] [address]   . aPTT 09/05/2020 29  24 - 36 seconds Final   Performed at [facility], [address] [place] [place], [place] [address]  . Color, Urine 09/05/2020 STRAW* YELLOW Final  . APPearance 09/05/2020 CLEAR* CLEAR Final  . Specific Gravity, Urine 09/05/2020 1.011  1.005 - 1.030 Final  . pH 09/05/2020 5.0  5.0 - 8.0 Final  . Glucose, UA 09/05/2020 NEGATIVE  NEGATIVE mg/dL Final  . Hgb urine dipstick 09/05/2020 SMALL* NEGATIVE Final  . Bilirubin Urine 09/05/2020 NEGATIVE  NEGATIVE Final  . Ketones, ur 09/05/2020 NEGATIVE  NEGATIVE mg/dL Final  . Protein, ur 35/02/5731 NEGATIVE  NEGATIVE mg/dL Final  . Nitrite 25/67/2091 NEGATIVE  NEGATIVE Final  . Glori Luis 09/05/2020 NEGATIVE  NEGATIVE Final  . RBC / HPF 09/05/2020 0-5  0 - 5 RBC/hpf Final  . WBC, UA 09/05/2020 NONE SEEN  0 - 5 WBC/hpf Final  . Bacteria, UA 09/05/2020 NONE SEEN  NONE SEEN Final  . Squamous Epithelial / LPF 09/05/2020 0-5  0 - 5 Final  . Mucus 09/05/2020 PRESENT   Final   Performed at [facility], [address] [place], [place] [address]  . Specimen Description 09/05/2020    Final                   Value:URINE, RANDOM Performed at [facility], [address] [place] [place], [place] [address]   . Special Requests 09/05/2020    Final                   Value:Normal Performed at [facility], [address] [place] [place], [place] [address]   . Culture 09/05/2020    Final                   Value:NO GROWTH Performed at [facility], [address], [place], [place] [address]   . Report Status 09/05/2020 09/07/2020 FINAL   Final  . ABO/RH(D) 09/05/2020 O POS   Final  . Antibody Screen 09/05/2020 NEG   Final  . Sample Expiration 09/05/2020 09/19/2020,2359    Final  . Extend sample reason 09/05/2020    Final  Value:NO TRANSFUSIONS OR PREGNANCY IN THE PAST 3 MONTHS Performed at [facility], [address] [place], [place] [address]   . CRP 09/05/2020 0.6  <1.0 mg/dL Final   Performed at [facility], [address] [place], [place] [address]  . MRSA, PCR 09/05/2020 NEGATIVE  NEGATIVE Final  . Staphylococcus aureus 09/05/2020 POSITIVE* NEGATIVE Final   Comment: (NOTE) The Xpert SA Assay (FDA approved for NASAL specimens in patients 47 years of age and older), is one component of a comprehensive surveillance program. It is not intended to diagnose infection nor to guide or monitor treatment. Performed at [facility], [address] [place], [place] [address]   . Sed Rate 09/05/2020 8  0 - 20 mm/hr Final   Performed at [facility], [address] [place], [place] [address]    ECG: Date: 09/05/2020 Time ECG obtained: 1142 AM Rate: 70 bpm Rhythm: AV paced rhythm with prolonged AV conduction Intervals: PR 214 ms. QRS 144 ms. QTc 447 ms. ST segment and T wave changes: No evidence of acute ST segment elevation or depression Comparison: Similar to previous tracing obtained on 07/01/2020    IMAGING / PROCEDURES: ECHOCARDIOGRAM performed on 07/08/2020 1. Left ventricular ejection fraction, by estimation, is 60 to 65%.  2. Left ventricular ejection fraction by 3D volume is 64 %.  3. The left ventricle has normal function.  4. The left ventricle has no regional wall motion abnormalities.  5. There is mild left ventricular hypertrophy.  6. Left ventricular diastolic parameters were normal.  7. Right ventricular systolic function is normal.  8. The right ventricular size is normal.  9. There is normal pulmonary artery systolic pressure.  10. The mitral valve is normal in structure. No evidence of mitral valve regurgitation.  11. The aortic valve is tricuspid. Aortic valve regurgitation  is not visualized.  12. The inferior vena cava is normal in size with greater than 50% respiratory variability, suggesting right atrial pressure of 3 mmHg.   DIAGNOSTIC RADIOGRAPHS LEFT KNEE performed on 05/20/2020 1. Anteroposterior, lateral, and sunrise views of the left knee were obtained.  2. Images reveal severe loss of medial compartment joint space with bone-on-bone contact.  3. Osteophyte formation is noted.  4. Lateral compartment well-preserved.  5. Mild loss of patellofemoral joint space is noted.  6. Screw from previous ACL reconstruction noted in the femur.  7. Evidence of removal of tibial screw noted.  8. No fractures or dislocations.  Impression and Plan:  Emma Long has been referred for pre-anesthesia review and clearance prior to her undergoing the planned anesthetic and procedural courses. Available labs, pertinent testing, and imaging results were personally reviewed by me. This patient has been appropriately cleared by cardiology with an overall ACCEPTABLE risk for significant perioperative cardiovascular complications.  Perioperative prescription for implanted cardiac device programming form completed by cardiology and placed on patient's chart for review by surgical/anesthetic team.  Of note, cardiology recommending that device representative be present for device reprogramming (asynchronous pacing).  [employer] representative was notified by PAT APP on 09/08/2020 that, as of 0930 on 09/08/2020, procedure is scheduled for 1230 pm. Vernona RiegerLaura (representative) stated, "we will put it on our calendar".   Based on clinical review performed today (09/08/20), barring any significant acute changes in the patient's overall condition, it is anticipated that she will be able to proceed with the planned surgical intervention. Any acute changes in clinical condition may necessitate her procedure being postponed and/or cancelled. Pre-surgical  instructions were reviewed with the  patient during her PAT appointment and questions were fielded by PAT clinical staff.  Quentin Mulling, MSN, APRN, FNP-C, CEN [facility]  Peri-operative Services Nurse Practitioner Phone: [phone] 09/08/20 9:39 AM  NOTE: This note has been prepared using Dragon dictation software. Despite my best ability to proofread, there is always the potential that unintentional transcriptional errors may still occur from this process.

## 2020-09-08 NOTE — Telephone Encounter (Signed)
Received a secure chat from Verlee Monte, NP as copied and pasted below. Looks like it is referencing the need for a device check. The patient belongs to Dr. Graciela Husbands. Will forward to his nurse Sherri Rad for further follow up.

## 2020-09-09 ENCOUNTER — Telehealth (HOSPITAL_COMMUNITY): Payer: Self-pay | Admitting: Adult Health

## 2020-09-09 NOTE — Telephone Encounter (Signed)
The 07/01/20 reference to device being checked with industry and reprogramming during a procedure. We will be placing her on the device clinic schedule for follow-up per protocol . She has a follow-up with Dr Graciela Husbands 11/11/20 in [place].

## 2020-09-09 NOTE — Telephone Encounter (Signed)
Patient contacted and confirmed she would like to start remote monitoring with our clinic. Patient will send a copy of her pacemaker card to give [place] lead info and implant dates. She will attempt t locate her remote monitor after moving and  Will contact the device clinic if she is unable to find it. She is aware that we will be contacting Dr Urban Gibson office in [place] at [facility] to have remote transferred to [facility] device clinic. Device clinic # provided for any questions or concerns.

## 2020-09-09 NOTE — Telephone Encounter (Signed)
Secure chat sent to Quentin Mulling, NP asking if I could speak with him to clarify what is needed for this patient.  Message received back as below: Emma Long... I spoke with the nurse who did the original documentation Unk Lightning). She has reached out to the rep and taken care of things. I think the rep may be calling your office to discuss recommendations from [facility]'s 11/23 note. I think we are good though... for now. HA. Thanks for responding.   Will close this encounter.

## 2020-09-09 NOTE — Progress Notes (Signed)
  [facility] Perioperative Services: Pre-Admission/Anesthesia Testing  Abnormal Lab Notification   Date: 09/09/20  Name: Emma Long MRN:   [id number]  Re: Abnormal labs noted during PAT appointment   Provider(s) Notified: Donato Heinz, MD Notification mode: Call placed to office; spoke with Tiffany (surgery scheduler)   ABNORMAL LAB VALUE(S): . Lab Results  Component Value Date   SARSCOV2NAA POSITIVE (A) 09/08/2020    Notes:  Patient is scheduled for a COMPUTER ASSISTED TOTAL KNEE ARTHROPLASTY (Left Knee) on 09/10/2020. Call placed to primary attending surgeon's office to discuss with surgery scheduler. MD already aware at this point. Surgeon's office to notify patient.    Quentin Mulling, MSN, APRN, FNP-C, CEN [facility]  Peri-operative Services Nurse Practitioner Phone: [phone] Fax: [phone] 09/09/20 8:17 AM

## 2020-09-09 NOTE — Telephone Encounter (Signed)
To Device clinic to review as this is in regards to her pacemaker.

## 2020-09-09 NOTE — H&P (Signed)
ORTHOPAEDIC HISTORY & PHYSICAL Latanya Maudlin, PA - 09/08/2020 10:00 AM EST Formatting of this note is different from the original. Images from the original note were not included. Chief Complaint Chief Complaint  Patient presents with  . Left Knee - Pain   Reason for Visit Emma Long is a 45 y.o. who presents today for history and physical. She is to undergo a left total knee arthroplasty 1-2 22. Last seen in clinic on 06/26/2020. There have been no change in her condition since that time.  She has a remote history of left ACL reconstruction and did well for many years. She reported the onset of left knee pain in March without any trauma or aggravating event. She localizes most of the pain along the medial aspect of the knee. She reports some swelling, no locking, and no giving way of the knee. The pain is aggravated by any weight bearing. The patient has not appreciated any significant improvement despite activity modification, intraarticular corticosteroid injection, and viscosupplementation. The knee pain limits the patient's ability to ambulate long distances. She has modified her exercise routine and tried to continue with low impact activities. She is not using any ambulatory aids. The patient states that the knee pain has progressed to the point that it is significantly interfering with her activities of daily living.  Past Medical History Past Medical History:  Diagnosis Date  . ADHD  . Chicken pox  . Heart disease  . Pacemaker   Past Surgical History Past Surgical History:  Procedure Laterality Date  . BILATERAL CARPAL TUNNEL SURGERY Bilateral  . Cardiac ablations N/A  . CONVERSION OF SINGLE CHAMBER PACEMAKER TO DUAL CHAMBER PACEMAKER  . Left ACL reconstruction Left  Dr. Sanda Linger  . LIPOMA REMOVED   Past Family History Family History  Problem Relation Age of Onset  . Breast cancer Mother  . High blood pressure (Hypertension) Mother  . Prostate cancer Father  . High  blood pressure (Hypertension) Father  . Parkinsonism Father  . Ovarian cysts Sister   Medications Current Outpatient Medications Ordered in Epic  Medication Sig Dispense Refill  . dextroamphetamine-amphetamine (ADDERALL) 20 mg tablet 20 mg 2 (two) times daily  . diclofenac (VOLTAREN) 1 % topical gel Apply topically  . flecainide (TAMBOCOR) 50 MG tablet 50 mg every 12 (twelve) hours  . metoprolol succinate (TOPROL-XL) 25 MG XL tablet 25 mg once daily  . VYVANSE 70 mg capsule 70 mg every morning  . ascorbic acid, vitamin C, (VITAMIN C) 1000 MG tablet Take 1,000 mg by mouth once daily (Patient not taking: Reported on 09/08/2020 )  . cholecalciferol, vitamin D3, 125 mcg (5,000 unit) TbDL Take 1 caplet by mouth once daily (Patient not taking: Reported on 09/08/2020 )  . zinc 50 mg Tab Take 1 tablet by mouth once daily (Patient not taking: Reported on 09/08/2020 )   No current Epic-ordered facility-administered medications on file.   Allergies Allergies  Allergen Reactions  . Iodinated Contrast Media Itching and Abdominal Pain  Itchy throat, sneezing  . Other Other (See Comments)  Vicryl sutures- dishiscence    Review of Systems A comprehensive 14 point ROS was performed, reviewed, and the pertinent orthopaedic findings are documented in the HPI.  Exam BP 112/78  Ht 154.9 cm (5\' 1" )  Wt 75.1 kg (165 lb 9.6 oz)  BMI 31.29 kg/m   General: Well-developed well-nourished female seen in no acute distress.   HEENT: Atraumatic,normocephalic. Pupils are equal and reactive to light. Oropharynx is clear with moist  mucosa  Lungs: Clear to auscultation bilaterally   Cardiovascular: Regular rate and rhythm. Normal S1, S2. No murmurs. No appreciable gallops or rubs. Peripheral pulses are palpable.  Abdomen: Soft, non-tender, nondistended. Bowel sounds present  Extremity: Left Knee: Soft tissue swelling: mild Effusion: none Erythema: none Crepitance: mild Tenderness:  medial Alignment: relative varus Mediolateral laxity: medial pseudolaxity Posterior sag: negative Patellar tracking: Good tracking without evidence of subluxation or tilt Atrophy: No significant atrophy.  Quadriceps tone was fair to good. Range of motion: 0/0/135 degrees  Neurological:  The patient is alert and oriented Sensation to light touch appears to be intact and within normal limits Gross motor strength appeared to be equal to 5/5  Vascular :  Peripheral pulses felt to be palpable. Capillary refill appears to be intact and within normal limits  X-ray  1. AP standing, lateral and sunrise view of the right knee ordered and interpreted on today's visit shows narrowing of the medial cartilage space with associated varus alignment. Patient was noted to have osteophytes as well as subchondral sclerosis. There was a interference screw to the distal femur. No evidence of any fractures or dislocations  Impression  1. Degenerative arthrosis left knee  Plan   1. Did go over the patient's medication list on today's visit. She is currently taking no anticoagulation medication. 2. Answered patient's questions to her satisfaction. 3. Return to clinic 2 weeks postop 4. Discussed postop course  This note was generated in part with voice recognition software and I apologize for any typographical errors that were not detected and corrected   Tera Partridge PA  Electronically signed by Latanya Maudlin, PA at 09/08/2020 10:35 AM EST

## 2020-09-09 NOTE — Telephone Encounter (Signed)
Called to discuss with patient about COVID-19 symptoms and the use of one of the available treatments for those with mild to moderate Covid symptoms and at a high risk of hospitalization.  Pt appears to qualify for outpatient treatment due to co-morbid conditions and/or a member of an at-risk group in accordance with the FDA Emergency Use Authorization.   Patient had COVID 3 weeks ago.  She is asymptomatic at this point, and her positive result was on presurgery testing.  She is now unable to have her surgery.  I reached out to the director of the office of patient experience and asked that they call her.  Emma Long

## 2020-09-11 ENCOUNTER — Institutional Professional Consult (permissible substitution): Payer: Self-pay | Admitting: Internal Medicine

## 2020-09-17 NOTE — Pre-Procedure Instructions (Signed)
Contacted patient- confirmed that pt had no changes to medications and that she still had copy of instructions and chg soap. Confirmed that patient knew to call between 1-3 day Friday 09-19-2020 to find our arrival time. Pt voiced understanding of instruction sheet.

## 2020-09-21 ENCOUNTER — Encounter: Payer: Self-pay | Admitting: Orthopedic Surgery

## 2020-09-22 ENCOUNTER — Encounter: Payer: Self-pay | Admitting: Orthopedic Surgery

## 2020-09-22 ENCOUNTER — Ambulatory Visit
Admission: RE | Admit: 2020-09-22 | Discharge: 2020-09-22 | Disposition: A | Discharge status: Home / Self Care | Payer: 59 | Attending: Orthopedic Surgery | Admitting: Orthopedic Surgery

## 2020-09-22 ENCOUNTER — Encounter
Admission: RE | Disposition: A | Discharge status: Home / Self Care | Payer: Self-pay | Source: Home / Self Care | Attending: Orthopedic Surgery

## 2020-09-22 ENCOUNTER — Ambulatory Visit: Payer: 59 | Admitting: Urgent Care

## 2020-09-22 ENCOUNTER — Ambulatory Visit: Payer: 59

## 2020-09-22 DIAGNOSIS — Z9109 Other allergy status, other than to drugs and biological substances: Secondary | ICD-10-CM | POA: Diagnosis not present

## 2020-09-22 DIAGNOSIS — Z79899 Other long term (current) drug therapy: Secondary | ICD-10-CM | POA: Insufficient documentation

## 2020-09-22 DIAGNOSIS — Z91041 Radiographic dye allergy status: Secondary | ICD-10-CM | POA: Insufficient documentation

## 2020-09-22 DIAGNOSIS — Z950 Presence of cardiac pacemaker: Secondary | ICD-10-CM | POA: Diagnosis not present

## 2020-09-22 DIAGNOSIS — Z967 Presence of other bone and tendon implants: Secondary | ICD-10-CM | POA: Insufficient documentation

## 2020-09-22 DIAGNOSIS — M1712 Unilateral primary osteoarthritis, left knee: Secondary | ICD-10-CM | POA: Insufficient documentation

## 2020-09-22 DIAGNOSIS — Z96659 Presence of unspecified artificial knee joint: Secondary | ICD-10-CM

## 2020-09-22 HISTORY — PX: APPLICATION OF WOUND VAC: SHX5189

## 2020-09-22 HISTORY — PX: KNEE ARTHROPLASTY: SHX992

## 2020-09-22 LAB — TYPE AND SCREEN
ABO/RH(D): O POS
Antibody Screen: NEGATIVE

## 2020-09-22 LAB — POCT PREGNANCY, URINE: Preg Test, Ur: NEGATIVE

## 2020-09-22 SURGERY — ARTHROPLASTY, KNEE, TOTAL, USING IMAGELESS COMPUTER-ASSISTED NAVIGATION
Anesthesia: Spinal | Site: Knee | Laterality: Left

## 2020-09-22 MED ORDER — ORAL CARE MOUTH RINSE: 15.0000 mL | Freq: Once | OROMUCOSAL | Status: AC

## 2020-09-22 MED ORDER — ACETAMINOPHEN 10 MG/ML IV SOLN
INTRAVENOUS | Status: AC
  Administered 2020-09-22: 1000 mg via INTRAVENOUS
  Filled 2020-09-22: qty 100

## 2020-09-22 MED ORDER — CELECOXIB 200 MG PO CAPS
ORAL_CAPSULE | ORAL | Status: AC
  Administered 2020-09-22: 400 mg via ORAL
  Filled 2020-09-22: qty 2

## 2020-09-22 MED ORDER — ENSURE PRE-SURGERY PO LIQD
296.0000 mL | Freq: Once | ORAL | Status: AC
  Administered 2020-09-22: 296 mL via ORAL
  Filled 2020-09-22: qty 296

## 2020-09-22 MED ORDER — LACTATED RINGERS IV BOLUS
250.0000 mL | Freq: Once | INTRAVENOUS | Status: AC
  Administered 2020-09-22: 250 mL via INTRAVENOUS

## 2020-09-22 MED ORDER — CEFAZOLIN SODIUM-DEXTROSE 2-4 GM/100ML-% IV SOLN: 2.0000 g | Freq: Four times a day (QID) | INTRAVENOUS | Status: DC

## 2020-09-22 MED ORDER — PHENYLEPHRINE HCL (PRESSORS) 10 MG/ML IV SOLN
INTRAVENOUS | Status: DC | PRN
  Administered 2020-09-22: 40 ug via INTRAVENOUS
  Administered 2020-09-22: 100 ug via INTRAVENOUS
  Administered 2020-09-22: 200 ug via INTRAVENOUS

## 2020-09-22 MED ORDER — SENNOSIDES-DOCUSATE SODIUM 8.6-50 MG PO TABS: 1.0000 | ORAL_TABLET | Freq: Two times a day (BID) | ORAL | Status: DC

## 2020-09-22 MED ORDER — FENTANYL CITRATE (PF) 100 MCG/2ML IJ SOLN
INTRAMUSCULAR | Status: DC | PRN
  Administered 2020-09-22: 25 ug via INTRAVENOUS

## 2020-09-22 MED ORDER — CELECOXIB 200 MG PO CAPS: 200.0000 mg | ORAL_CAPSULE | Freq: Two times a day (BID) | ORAL | 2 refills | Status: DC

## 2020-09-22 MED ORDER — LACTATED RINGERS IV BOLUS
500.0000 mL | Freq: Once | INTRAVENOUS | Status: AC
  Administered 2020-09-22: 500 mL via INTRAVENOUS

## 2020-09-22 MED ORDER — CHLORHEXIDINE GLUCONATE 4 % EX LIQD
60.0000 mL | Freq: Once | CUTANEOUS | Status: AC
  Administered 2020-09-22: 4 via TOPICAL

## 2020-09-22 MED ORDER — GABAPENTIN 300 MG PO CAPS
ORAL_CAPSULE | ORAL | Status: AC
  Filled 2020-09-22: qty 1

## 2020-09-22 MED ORDER — PROPOFOL 500 MG/50ML IV EMUL
INTRAVENOUS | Status: AC
  Filled 2020-09-22: qty 50

## 2020-09-22 MED ORDER — OXYCODONE HCL 5 MG PO TABS: 5.0000 mg | ORAL_TABLET | ORAL | Status: DC | PRN

## 2020-09-22 MED ORDER — DEXAMETHASONE SODIUM PHOSPHATE 10 MG/ML IJ SOLN
INTRAMUSCULAR | Status: AC
  Administered 2020-09-22: 8 mg via INTRAVENOUS
  Filled 2020-09-22: qty 1

## 2020-09-22 MED ORDER — BISACODYL 10 MG RE SUPP
10.0000 mg | Freq: Every day | RECTAL | Status: DC | PRN
Start: 2020-09-22 — End: 2020-09-22
  Filled 2020-09-22: qty 1

## 2020-09-22 MED ORDER — TRANEXAMIC ACID-NACL 1000-0.7 MG/100ML-% IV SOLN
1000.0000 mg | INTRAVENOUS | Status: AC
  Administered 2020-09-22: 1000 mg via INTRAVENOUS

## 2020-09-22 MED ORDER — ENOXAPARIN SODIUM 40 MG/0.4ML ~~LOC~~ SOLN: 40.0000 mg | SUBCUTANEOUS | Status: DC

## 2020-09-22 MED ORDER — SURGIPHOR WOUND IRRIGATION SYSTEM - OPTIME
TOPICAL | Status: DC | PRN
  Administered 2020-09-22: 450 mL via TOPICAL

## 2020-09-22 MED ORDER — MAGNESIUM HYDROXIDE 400 MG/5ML PO SUSP: 30.0000 mL | Freq: Every day | ORAL | Status: DC

## 2020-09-22 MED ORDER — CHLORHEXIDINE GLUCONATE 0.12 % MT SOLN: 15.0000 mL | Freq: Once | OROMUCOSAL | Status: AC

## 2020-09-22 MED ORDER — PROPOFOL 500 MG/50ML IV EMUL
INTRAVENOUS | Status: DC | PRN
  Administered 2020-09-22: 125 ug/kg/min via INTRAVENOUS

## 2020-09-22 MED ORDER — ONDANSETRON HCL 4 MG/2ML IJ SOLN: 4.0000 mg | Freq: Once | INTRAMUSCULAR | Status: DC | PRN

## 2020-09-22 MED ORDER — BUPIVACAINE HCL (PF) 0.25 % IJ SOLN
INTRAMUSCULAR | Status: DC | PRN
  Administered 2020-09-22: 60 mL

## 2020-09-22 MED ORDER — CEFAZOLIN SODIUM-DEXTROSE 2-4 GM/100ML-% IV SOLN
INTRAVENOUS | Status: AC
  Filled 2020-09-22: qty 100

## 2020-09-22 MED ORDER — ACETAMINOPHEN 10 MG/ML IV SOLN: 1000.0000 mg | Freq: Four times a day (QID) | INTRAVENOUS | Status: DC

## 2020-09-22 MED ORDER — TRANEXAMIC ACID-NACL 1000-0.7 MG/100ML-% IV SOLN: 1000.0000 mg | Freq: Once | INTRAVENOUS | Status: AC

## 2020-09-22 MED ORDER — MIDAZOLAM HCL 5 MG/5ML IJ SOLN
INTRAMUSCULAR | Status: DC | PRN
  Administered 2020-09-22: 2 mg via INTRAVENOUS

## 2020-09-22 MED ORDER — ALUM & MAG HYDROXIDE-SIMETH 200-200-20 MG/5ML PO SUSP: 30.0000 mL | ORAL | Status: DC | PRN

## 2020-09-22 MED ORDER — CEFAZOLIN SODIUM-DEXTROSE 2-4 GM/100ML-% IV SOLN
INTRAVENOUS | Status: AC
  Administered 2020-09-22: 2 g via INTRAVENOUS
  Filled 2020-09-22: qty 100

## 2020-09-22 MED ORDER — ENOXAPARIN SODIUM 40 MG/0.4ML ~~LOC~~ SOLN: 40.0000 mg | SUBCUTANEOUS | 0 refills | Status: DC

## 2020-09-22 MED ORDER — SODIUM CHLORIDE 0.9 % IV SOLN
INTRAVENOUS | Status: DC | PRN
  Administered 2020-09-22: 60 mL

## 2020-09-22 MED ORDER — OXYCODONE HCL 5 MG PO TABS
ORAL_TABLET | ORAL | Status: AC
  Administered 2020-09-22: 5 mg via ORAL
  Filled 2020-09-22: qty 1

## 2020-09-22 MED ORDER — METOCLOPRAMIDE HCL 10 MG PO TABS: 10.0000 mg | ORAL_TABLET | Freq: Three times a day (TID) | ORAL | Status: DC

## 2020-09-22 MED ORDER — CELECOXIB 200 MG PO CAPS: 200.0000 mg | ORAL_CAPSULE | Freq: Two times a day (BID) | ORAL | Status: DC

## 2020-09-22 MED ORDER — PHENOL 1.4 % MT LIQD
1.0000 | OROMUCOSAL | Status: DC | PRN
  Filled 2020-09-22: qty 177

## 2020-09-22 MED ORDER — SODIUM CHLORIDE 0.9 % IV SOLN
INTRAVENOUS | Status: DC | PRN
  Administered 2020-09-22: 40 ug/min via INTRAVENOUS

## 2020-09-22 MED ORDER — TRANEXAMIC ACID-NACL 1000-0.7 MG/100ML-% IV SOLN
INTRAVENOUS | Status: AC
  Filled 2020-09-22: qty 100

## 2020-09-22 MED ORDER — PANTOPRAZOLE SODIUM 40 MG PO TBEC
40.0000 mg | DELAYED_RELEASE_TABLET | Freq: Two times a day (BID) | ORAL | Status: DC
Start: 2020-09-22 — End: 2020-09-22

## 2020-09-22 MED ORDER — ONDANSETRON HCL 4 MG PO TABS: 4.0000 mg | ORAL_TABLET | Freq: Four times a day (QID) | ORAL | Status: DC | PRN

## 2020-09-22 MED ORDER — NEOMYCIN-POLYMYXIN B GU 40-200000 IR SOLN
Status: DC | PRN
  Administered 2020-09-22: 16 mL

## 2020-09-22 MED ORDER — MIDAZOLAM HCL 2 MG/2ML IJ SOLN
INTRAMUSCULAR | Status: AC
  Filled 2020-09-22: qty 2

## 2020-09-22 MED ORDER — FAMOTIDINE 20 MG PO TABS: 20.0000 mg | ORAL_TABLET | Freq: Once | ORAL | Status: AC

## 2020-09-22 MED ORDER — FAMOTIDINE 20 MG PO TABS
ORAL_TABLET | ORAL | Status: AC
  Administered 2020-09-22: 20 mg via ORAL
  Filled 2020-09-22: qty 1

## 2020-09-22 MED ORDER — BUPIVACAINE HCL (PF) 0.5 % IJ SOLN
INTRAMUSCULAR | Status: DC | PRN
  Administered 2020-09-22: 2.8 mL

## 2020-09-22 MED ORDER — CEFAZOLIN SODIUM-DEXTROSE 2-4 GM/100ML-% IV SOLN
2.0000 g | INTRAVENOUS | Status: AC
  Administered 2020-09-22: 2 g via INTRAVENOUS

## 2020-09-22 MED ORDER — GABAPENTIN 300 MG PO CAPS
300.0000 mg | ORAL_CAPSULE | Freq: Once | ORAL | Status: AC
  Administered 2020-09-22: 300 mg via ORAL

## 2020-09-22 MED ORDER — FENTANYL CITRATE (PF) 100 MCG/2ML IJ SOLN
INTRAMUSCULAR | Status: AC
  Filled 2020-09-22: qty 2

## 2020-09-22 MED ORDER — FLEET ENEMA 7-19 GM/118ML RE ENEM: 1.0000 | ENEMA | Freq: Once | RECTAL | Status: DC | PRN

## 2020-09-22 MED ORDER — FENTANYL CITRATE (PF) 100 MCG/2ML IJ SOLN: 25.0000 ug | INTRAMUSCULAR | Status: DC | PRN

## 2020-09-22 MED ORDER — PROPOFOL 10 MG/ML IV BOLUS
INTRAVENOUS | Status: AC
  Filled 2020-09-22: qty 20

## 2020-09-22 MED ORDER — CHLORHEXIDINE GLUCONATE 0.12 % MT SOLN
OROMUCOSAL | Status: AC
  Administered 2020-09-22: 15 mL via OROMUCOSAL
  Filled 2020-09-22: qty 15

## 2020-09-22 MED ORDER — TRANEXAMIC ACID-NACL 1000-0.7 MG/100ML-% IV SOLN
INTRAVENOUS | Status: AC
  Administered 2020-09-22: 1000 mg via INTRAVENOUS
  Filled 2020-09-22: qty 100

## 2020-09-22 MED ORDER — CELECOXIB 200 MG PO CAPS: 400.0000 mg | ORAL_CAPSULE | Freq: Once | ORAL | Status: AC

## 2020-09-22 MED ORDER — LACTATED RINGERS IV SOLN: INTRAVENOUS | Status: DC

## 2020-09-22 MED ORDER — DIPHENHYDRAMINE HCL 12.5 MG/5ML PO ELIX
12.5000 mg | ORAL_SOLUTION | ORAL | Status: DC | PRN
  Filled 2020-09-22: qty 10

## 2020-09-22 MED ORDER — ONDANSETRON HCL 4 MG/2ML IJ SOLN: 4.0000 mg | Freq: Four times a day (QID) | INTRAMUSCULAR | Status: DC | PRN

## 2020-09-22 MED ORDER — OXYCODONE HCL 5 MG PO TABS: 5.0000 mg | ORAL_TABLET | ORAL | 0 refills | Status: DC | PRN

## 2020-09-22 MED ORDER — FERROUS SULFATE 325 (65 FE) MG PO TABS: 325.0000 mg | ORAL_TABLET | Freq: Two times a day (BID) | ORAL | Status: DC

## 2020-09-22 MED ORDER — TRAMADOL HCL 50 MG PO TABS: 50.0000 mg | ORAL_TABLET | ORAL | Status: DC | PRN

## 2020-09-22 MED ORDER — DEXAMETHASONE SODIUM PHOSPHATE 10 MG/ML IJ SOLN: 8.0000 mg | Freq: Once | INTRAMUSCULAR | Status: AC

## 2020-09-22 MED ORDER — HYDROMORPHONE HCL 1 MG/ML IJ SOLN: 0.5000 mg | INTRAMUSCULAR | Status: DC | PRN

## 2020-09-22 MED ORDER — MENTHOL 3 MG MT LOZG
1.0000 | LOZENGE | OROMUCOSAL | Status: DC | PRN
  Filled 2020-09-22: qty 9

## 2020-09-22 MED ORDER — ACETAMINOPHEN 10 MG/ML IV SOLN
INTRAVENOUS | Status: AC
  Filled 2020-09-22: qty 100

## 2020-09-22 MED ORDER — SODIUM CHLORIDE 0.9 % IV SOLN: INTRAVENOUS | Status: DC

## 2020-09-22 MED ORDER — ACETAMINOPHEN 325 MG PO TABS: 325.0000 mg | ORAL_TABLET | Freq: Four times a day (QID) | ORAL | Status: DC | PRN

## 2020-09-22 SURGICAL SUPPLY — 83 items
ATTUNE MED DOME PAT 32 KNEE (Knees) ×1 IMPLANT
ATTUNE PSFEM LTSZ4 NARCEM KNEE (Femur) ×1 IMPLANT
ATTUNE PSRP INSR SZ4 5 KNEE (Insert) ×1 IMPLANT
BASE TIBIAL ROT PLAT SZ 3 KNEE (Knees) IMPLANT
BATTERY INSTRU NAVIGATION (MISCELLANEOUS) ×8 IMPLANT
BLADE SAW 70X12.5 (BLADE) ×2 IMPLANT
BLADE SAW 90X13X1.19 OSCILLAT (BLADE) ×2 IMPLANT
BLADE SAW 90X25X1.19 OSCILLAT (BLADE) ×2 IMPLANT
BONE CEMENT GENTAMICIN (Cement) ×2 IMPLANT
BSPLAT TIB 3 CMNT ROT PLAT STR (Knees) ×1 IMPLANT
BTRY SRG DRVR LF (MISCELLANEOUS) ×4
CANISTER PREVENA PLUS 150 (CANNISTER) ×2 IMPLANT
CANISTER SUCT 3000ML PPV (MISCELLANEOUS) ×2 IMPLANT
CEMENT BONE GENTAMICIN 40 (Cement) IMPLANT
COOLER POLAR GLACIER W/PUMP (MISCELLANEOUS) ×2 IMPLANT
COVER WAND RF STERILE (DRAPES) ×2 IMPLANT
CUFF TOURN SGL QUICK 30 (TOURNIQUET CUFF) ×2
CUFF TRNQT CYL 30X4X21-28X (TOURNIQUET CUFF) IMPLANT
DRAPE 3/4 80X56 (DRAPES) ×2 IMPLANT
DRSG DERMACEA 8X12 NADH (GAUZE/BANDAGES/DRESSINGS) ×2 IMPLANT
DRSG MEPILEX SACRM 8.7X9.8 (GAUZE/BANDAGES/DRESSINGS) ×2 IMPLANT
DRSG OPSITE POSTOP 4X14 (GAUZE/BANDAGES/DRESSINGS) ×2 IMPLANT
DRSG TEGADERM 4X4.75 (GAUZE/BANDAGES/DRESSINGS) ×2 IMPLANT
DURAPREP 26ML APPLICATOR (WOUND CARE) ×4 IMPLANT
ELECT REM PT RETURN 9FT ADLT (ELECTROSURGICAL) ×2
ELECTRODE REM PT RTRN 9FT ADLT (ELECTROSURGICAL) ×1 IMPLANT
EX-PIN ORTHOLOCK NAV 4X150 (PIN) ×4 IMPLANT
GLOVE BIOGEL PI IND STRL 7.5 (GLOVE) ×1 IMPLANT
GLOVE BIOGEL PI INDICATOR 7.5 (GLOVE) ×1
GLOVE INDICATOR 8.0 STRL GRN (GLOVE) ×2 IMPLANT
GLOVE SURG ENC MOIS LTX SZ7.5 (GLOVE) ×4 IMPLANT
GLOVE SURG ENC TEXT LTX SZ7.5 (GLOVE) ×4 IMPLANT
GOWN STRL REUS W/ TWL LRG LVL3 (GOWN DISPOSABLE) ×2 IMPLANT
GOWN STRL REUS W/ TWL XL LVL3 (GOWN DISPOSABLE) ×1 IMPLANT
GOWN STRL REUS W/TWL LRG LVL3 (GOWN DISPOSABLE) ×4
GOWN STRL REUS W/TWL XL LVL3 (GOWN DISPOSABLE) ×2
HEMOVAC 400CC 10FR (MISCELLANEOUS) ×2 IMPLANT
HOLDER FOLEY CATH W/STRAP (MISCELLANEOUS) ×2 IMPLANT
HOOD PEEL AWAY FLYTE STAYCOOL (MISCELLANEOUS) ×4 IMPLANT
IRRIGATION SURGIPHOR STRL (IV SOLUTION) ×2 IMPLANT
KIT PREVENA INCISION MGT20CM45 (CANNISTER) ×2 IMPLANT
KIT PUMP PREVENA PLUS 14DAY (MISCELLANEOUS) ×2 IMPLANT
KIT TURNOVER KIT A (KITS) ×2 IMPLANT
KNIFE SCULPS 14X20 (INSTRUMENTS) ×2 IMPLANT
LABEL OR SOLS (LABEL) ×2 IMPLANT
MANIFOLD NEPTUNE II (INSTRUMENTS) ×4 IMPLANT
NDL SAFETY ECLIPSE 18X1.5 (NEEDLE) ×1 IMPLANT
NDL SPNL 20GX3.5 QUINCKE YW (NEEDLE) ×2 IMPLANT
NEEDLE HYPO 18GX1.5 SHARP (NEEDLE) ×2
NEEDLE SPNL 20GX3.5 QUINCKE YW (NEEDLE) ×4 IMPLANT
NS IRRIG 500ML POUR BTL (IV SOLUTION) ×2 IMPLANT
PACK TOTAL KNEE (MISCELLANEOUS) ×2 IMPLANT
PAD ABD DERMACEA PRESS 5X9 (GAUZE/BANDAGES/DRESSINGS) ×1 IMPLANT
PAD CAST CTTN 4X4 STRL (SOFTGOODS) IMPLANT
PAD WRAPON POLAR KNEE (MISCELLANEOUS) ×1 IMPLANT
PADDING CAST COTTON 4X4 STRL (SOFTGOODS) ×2
PENCIL SMOKE ULTRAEVAC 22 CON (MISCELLANEOUS) ×2 IMPLANT
PIN DRILL QUICK PACK ×2 IMPLANT
PIN FIXATION 1/8DIA X 3INL (PIN) ×6 IMPLANT
PULSAVAC PLUS IRRIG FAN TIP (DISPOSABLE) ×2
SOL .9 NS 3000ML IRR  AL (IV SOLUTION) ×1
SOL .9 NS 3000ML IRR AL (IV SOLUTION) ×1
SOL .9 NS 3000ML IRR UROMATIC (IV SOLUTION) ×1 IMPLANT
SOL PREP PVP 2OZ (MISCELLANEOUS) ×2
SOLUTION PREP PVP 2OZ (MISCELLANEOUS) ×1 IMPLANT
SPONGE DRAIN TRACH 4X4 STRL 2S (GAUZE/BANDAGES/DRESSINGS) ×2 IMPLANT
STAPLER SKIN PROX 35W (STAPLE) ×2 IMPLANT
STOCKINETTE BIAS CUT 6 980064 (GAUZE/BANDAGES/DRESSINGS) ×1 IMPLANT
STOCKINETTE IMPERV 14X48 (MISCELLANEOUS) ×1 IMPLANT
STRAP TIBIA SHORT (MISCELLANEOUS) ×2 IMPLANT
SUCTION FRAZIER HANDLE 10FR (MISCELLANEOUS) ×1
SUCTION TUBE FRAZIER 10FR DISP (MISCELLANEOUS) ×1 IMPLANT
SUT VIC AB 0 CT1 36 (SUTURE) ×4 IMPLANT
SUT VIC AB 1 CT1 36 (SUTURE) ×4 IMPLANT
SUT VIC AB 2-0 CT2 27 (SUTURE) ×2 IMPLANT
SYR 20ML LL LF (SYRINGE) ×2 IMPLANT
SYR 30ML LL (SYRINGE) ×4 IMPLANT
TIBIAL BASE ROT PLAT SZ 3 KNEE (Knees) ×2 IMPLANT
TIP FAN IRRIG PULSAVAC PLUS (DISPOSABLE) ×1 IMPLANT
TOWEL OR 17X26 4PK STRL BLUE (TOWEL DISPOSABLE) ×2 IMPLANT
TOWER CARTRIDGE SMART MIX (DISPOSABLE) ×2 IMPLANT
TRAY FOLEY MTR SLVR 16FR STAT (SET/KITS/TRAYS/PACK) ×2 IMPLANT
WRAPON POLAR PAD KNEE (MISCELLANEOUS) ×2

## 2020-09-22 NOTE — H&P (Signed)
The patient has been re-examined, and the chart reviewed, and there have been no interval changes to the documented history and physical.    The risks, benefits, and alternatives have been discussed at length. The patient expressed understanding of the risks benefits and agreed with plans for surgical intervention.  Hubert Raatz P. Shellby Schlink, Jr. M.D.    

## 2020-09-22 NOTE — Op Note (Addendum)
OPERATIVE NOTE  DATE OF SURGERY:  09/22/2020  PATIENT NAME:  Klee Kolek   DOB: 1975/10/20  MRN: [id number]  PRE-OPERATIVE DIAGNOSIS: Degenerative arthrosis of the left knee, primary Retained interference screw status post left ACL reconstruction  POST-OPERATIVE DIAGNOSIS:  Same  PROCEDURE:  Left total knee arthroplasty using computer-assisted navigation, Removal of retained interference screw from the left knee  SURGEON:  Jena Gauss. M.D.  ASSISTANT: Baldwin Jamaica, PA-C (present and scrubbed throughout the case, critical for assistance with exposure, retraction, instrumentation, and closure)  ANESTHESIA: spinal  ESTIMATED BLOOD LOSS: 50 mL  FLUIDS REPLACED: 1000 mL of crystalloid  TOURNIQUET TIME: 90 minutes  DRAINS: Wound VAC  SOFT TISSUE RELEASES: Anterior cruciate ligament, posterior cruciate ligament, deep medial collateral ligament, patellofemoral ligament  IMPLANTS UTILIZED: DePuy Attune size 4N posterior stabilized femoral component (cemented), size 3 rotating platform tibial component (cemented), 32 mm medialized dome patella (cemented), and a 5 mm stabilized rotating platform polyethylene insert.  INDICATIONS FOR SURGERY: Leyani Gargus is a 45 y.o. year old female with a long history of progressive knee pain. X-rays demonstrated severe degenerative changes in tricompartmental fashion. The patient had not seen any significant improvement despite conservative nonsurgical intervention. After discussion of the risks and benefits of surgical intervention, the patient expressed understanding of the risks benefits and agree with plans for total knee arthroplasty.   The risks, benefits, and alternatives were discussed at length including but not limited to the risks of infection, bleeding, nerve injury, stiffness, blood clots, the need for revision surgery, cardiopulmonary complications, among others, and they were willing to proceed.  PROCEDURE IN DETAIL: The patient was  brought into the operating room and, after adequate spinal anesthesia was achieved, a tourniquet was placed on the patient's upper thigh. The patient's knee and leg were cleaned and prepped with alcohol and DuraPrep and draped in the usual sterile fashion. A "timeout" was performed as per usual protocol. The lower extremity was exsanguinated using an Esmarch, and the tourniquet was inflated to 300 mmHg. An anterior longitudinal incision was made followed by a standard mid vastus approach. The deep fibers of the medial collateral ligament were elevated in a subperiosteal fashion off of the medial flare of the tibia so as to maintain a continuous soft tissue sleeve. The patella was subluxed laterally and the patellofemoral ligament was incised. Inspection of the knee demonstrated severe degenerative changes with full-thickness loss of articular cartilage. Osteophytes were debrided using a rongeur. Anterior and posterior cruciate ligaments were excised. Two 4.0 mm Schanz pins were inserted in the femur and into the tibia for attachment of the array of trackers used for computer-assisted navigation. Hip center was identified using a circumduction technique. Distal landmarks were mapped using the computer. The distal femur and proximal tibia were mapped using the computer. The distal femoral cutting guide was positioned using computer-assisted navigation so as to achieve a 5 distal valgus cut. The femur was sized and it was felt that a size 4N femoral component was appropriate. A size 4 femoral cutting guide was positioned and the anterior cut was performed and verified using the computer. This was followed by completion of the posterior and chamfer cuts.  The head of interference screw was noted within the notch.  Some overhanging bone was removed using the rongeurs and then the interference screw was carefully removed.  Femoral cutting guide for the central box was then positioned in the center box cut was  performed.  Attention was then directed to the proximal  tibia. Medial and lateral menisci were excised. The extramedullary tibial cutting guide was positioned using computer-assisted navigation so as to achieve a 0 varus-valgus alignment and 3 posterior slope. The cut was performed and verified using the computer. The proximal tibia was sized and it was felt that a size 3 tibial tray was appropriate. Tibial and femoral trials were inserted followed by insertion of a 5 mm polyethylene insert. This allowed for excellent mediolateral soft tissue balancing both in flexion and in full extension. Finally, the patella was cut and prepared so as to accommodate a 32 mm medialized dome patella. A patella trial was placed and the knee was placed through a range of motion with excellent patellar tracking appreciated. The femoral trial was removed after debridement of posterior osteophytes. The central post-hole for the tibial component was reamed followed by insertion of a keel punch. Tibial trials were then removed. Cut surfaces of bone were irrigated with copious amounts of normal saline using pulsatile lavage and then suctioned dry. Polymethylmethacrylate cement with gentamicin was prepared in the usual fashion using a vacuum mixer. Cement was applied to the cut surface of the proximal tibia as well as along the undersurface of a size 3 rotating platform tibial component. Tibial component was positioned and impacted into place. Excess cement was removed using Personal assistant. Cement was then applied to the cut surfaces of the femur as well as along the posterior flanges of the size 4N femoral component. The femoral component was positioned and impacted into place. Excess cement was removed using Personal assistant. A 5 mm polyethylene trial was inserted and the knee was brought into full extension with steady axial compression applied. Finally, cement was applied to the backside of a 32 mm medialized dome patella and the  patellar component was positioned and patellar clamp applied. Excess cement was removed using Personal assistant. After adequate curing of the cement, the tourniquet was deflated after a total tourniquet time of 90 minutes. Hemostasis was achieved using electrocautery. The knee was irrigated with copious amounts of normal saline using pulsatile lavage followed by 500 ml of Surgiphor and then suctioned dry. 20 mL of 1.3% Exparel and 60 mL of 0.25% Marcaine in 40 mL of normal saline was injected along the posterior capsule, medial and lateral gutters, and along the arthrotomy site. A 5 mm stabilized rotating platform polyethylene insert was inserted and the knee was placed through a range of motion with excellent mediolateral soft tissue balancing appreciated and excellent patellar tracking noted. The medial parapatellar portion of the incision was reapproximated using interrupted sutures of #1 Vicryl. Subcutaneous tissue was approximated in layers using first #0 Vicryl followed #2-0 Vicryl. The skin was approximated with skin staples. A wound VAC and a sterile dressing were applied.  The patient tolerated the procedure well and was transported to the recovery room in stable condition.    Vetta Couzens P. Angie Fava., M.D.

## 2020-09-22 NOTE — Evaluation (Signed)
Physical Therapy Evaluation Patient Details Name: Emma Long MRN: [id number] DOB: 1976/03/24 Today's Date: 09/22/2020   History of Present Illness  Pt is a 45 yo female diagnosed with degenerative arthrosis of the left knee and is s/p elective L TKA. PMH includes: A-fib s/p pacemaker, heart disease with EF 60-65%, and ADHD.    Clinical Impression  Pt was pleasant and motivated to participate during the session.  Pt required no physical assistance with bed mobility tasks or transfers and demonstrated good eccentric and concentric control and stability during sit to/from stand.  Pt was able to amb 60 feet with a RW but her R knee buckled 2-3 times during ambulation and once during stair training but pt able to self-correct without assist, nursing and ortho MD aware.  Other than occasional R knee buckles the pt performed very well especially considering POD#0 status. Pt will benefit from HHPT services upon discharge to safely address deficits listed in patient problem list for decreased caregiver assistance and eventual return to PLOF.      Follow Up Recommendations Home health PT;Supervision for mobility/OOB    Equipment Recommendations  3in1 (PT)    Recommendations for Other Services       Precautions / Restrictions Precautions Precautions: Fall Restrictions Weight Bearing Restrictions: Yes LLE Weight Bearing: Weight bearing as tolerated Other Position/Activity Restrictions: Pt able to perform BLE SLRs Independently without extensor lag      Mobility  Bed Mobility Overal bed mobility: Modified Independent             General bed mobility comments: Extra time/effort only    Transfers Overall transfer level: Needs assistance Equipment used: Rolling walker (2 wheeled) Transfers: Sit to/from Stand Sit to Stand: Min guard         General transfer comment: Min multi-modal cues for sequecing with good carryover  Ambulation/Gait Ambulation/Gait assistance: Min  guard Gait Distance (Feet): 60 Feet Assistive device: Rolling walker (2 wheeled) Gait Pattern/deviations: Step-through pattern;Decreased step length - left;Decreased step length - right Gait velocity: decreased   General Gait Details: Pt presented with several instances of R knee buckling during ambulation with pt able to self-correct without assistance; no buckling or instability with surgical L knee  Stairs Stairs: Yes Stairs assistance: Min guard Stair Management: Two rails;Backwards;Forwards Number of Stairs: 3 General stair comments: Pt able to ascend/descend one step x 3 with multi-modal cues for sequencing; one instance of R knee buckling during eccentric phase with pt able to self-correct  Wheelchair Mobility    Modified Rankin (Stroke Patients Only)       Balance Overall balance assessment: Needs assistance   Sitting balance-Leahy Scale: Normal     Standing balance support: Bilateral upper extremity supported;During functional activity Standing balance-Leahy Scale: Good                               Pertinent Vitals/Pain Pain Assessment: No/denies pain    Home Living Family/patient expects to be discharged to:: Private residence Living Arrangements: Spouse/significant other;Children Available Help at Discharge: Family;Available 24 hours/day Type of Home: Apartment Home Access: Elevator     Home Layout: One level Home Equipment: Walker - 2 wheels;Grab bars - toilet      Prior Function Level of Independence: Independent         Comments: Ind Amb community distances without an AD, no fall history, Ind with ADLs     Hand Dominance  Extremity/Trunk Assessment   Upper Extremity Assessment Upper Extremity Assessment: Overall WFL for tasks assessed    Lower Extremity Assessment Lower Extremity Assessment: Generalized weakness;RLE deficits/detail;LLE deficits/detail RLE Deficits / Details: R knee buckled several times during  ambulation and stair training but pt able to self-correct without assist RLE Sensation: WNL RLE Coordination: WNL LLE: Unable to fully assess due to pain; BLE ankle strength, AROM, and sensation to light touch grossly intact; pt able to perform Ind BLE SLRs without extensor lag with ease LLE Sensation: WNL LLE Coordination: WNL       Communication   Communication: No difficulties  Cognition Arousal/Alertness: Awake/alert Behavior During Therapy: WFL for tasks assessed/performed Overall Cognitive Status: Within Functional Limits for tasks assessed                                        General Comments      Exercises Total Joint Exercises Ankle Circles/Pumps: AROM;Strengthening;Both;10 reps Quad Sets: AROM;Strengthening;Left;5 reps;10 reps Straight Leg Raises: AROM;Strengthening;Both;5 reps Long Arc Quad: AROM;Strengthening;Left;10 reps;15 reps Knee Flexion: AROM;Strengthening;Left;10 reps;15 reps Goniometric ROM: L knee AROM: 2-80 deg Marching in Standing: AROM;Strengthening;Both;10 reps;Standing Other Exercises Other Exercises: HEP education/review per handout Other Exercises: Car transfer sequencing education verbally and with visual simulation Other Exercises: 90 deg left turn training to prevent CKC twisting on the L knee Other Exercises: Positioning education to promote L knee ext PROM  Pt/spouse present for ambulation, transfer, and stair training with education provided on chair follow from car to apt entrance as well as use of a gait belt during ambulation for increased safety.   Assessment/Plan    PT Assessment Patient needs continued PT services  PT Problem List Decreased strength;Decreased range of motion;Decreased activity tolerance;Decreased balance;Decreased mobility;Decreased knowledge of use of DME       PT Treatment Interventions DME instruction;Gait training;Stair training;Functional mobility training;Therapeutic activities;Therapeutic  exercise;Balance training;Patient/family education    PT Goals (Current goals can be found in the Care Plan section)  Acute Rehab PT Goals Patient Stated Goal: To be able to exercise and move without L knee pain PT Goal Formulation: With patient Time For Goal Achievement: 10/05/20 Potential to Achieve Goals: Good    Frequency BID   Barriers to discharge        Co-evaluation               AM-PAC PT "6 Clicks" Mobility  Outcome Measure Help needed turning from your back to your side while in a flat bed without using bedrails?: None Help needed moving from lying on your back to sitting on the side of a flat bed without using bedrails?: None Help needed moving to and from a bed to a chair (including a wheelchair)?: A Little Help needed standing up from a chair using your arms (e.g., wheelchair or bedside chair)?: A Little Help needed to walk in hospital room?: A Little Help needed climbing 3-5 steps with a railing? : A Little 6 Click Score: 20    End of Session Equipment Utilized During Treatment: Gait belt Activity Tolerance: Patient tolerated treatment well Patient left: in chair;with nursing/sitter in room;with family/visitor present (Pt in PACU preparing for d/c with nurse and MD present) Nurse Communication: Mobility status;Other (comment) (Nurse and ortho MD made aware of R knee buckling during session) PT Visit Diagnosis: Muscle weakness (generalized) (M62.81);Other abnormalities of gait and mobility (R26.89)    Time: 9024-0973 PT Time  Calculation (min) (ACUTE ONLY): 59 min   Charges:   PT Evaluation $PT Eval Moderate Complexity: 1 Mod PT Treatments $Gait Training: 8-22 mins $Therapeutic Exercise: 8-22 mins $Therapeutic Activity: 8-22 mins        D. Elly Modena PT, DPT 09/22/20, 5:38 PM

## 2020-09-22 NOTE — Anesthesia Preprocedure Evaluation (Addendum)
Anesthesia Evaluation  Patient identified by MRN, date of birth, ID band Patient awake    Reviewed: Allergy & Precautions, NPO status , Patient's Chart, lab work & pertinent test results  Airway Mallampati: III       Dental   Pulmonary neg pulmonary ROS,           Cardiovascular + dysrhythmias Atrial Fibrillation + pacemaker      Neuro/Psych PSYCHIATRIC DISORDERS negative neurological ROS     GI/Hepatic negative GI ROS, Neg liver ROS,   Endo/Other  negative endocrine ROS  Renal/GU negative Renal ROS  negative genitourinary   Musculoskeletal negative musculoskeletal ROS (+)   Abdominal   Peds  Hematology negative hematology ROS (+)   Anesthesia Other Findings Past Medical History: No date: ADHD No date: Arrhythmia No date: Atrial flutter (HCC) No date: Chicken pox 06/30/2020: Heart disease 02/1999: Pacemaker 03/06/2018: Pain in left knee No date: Sinoatrial node dysfunction (HCC) Echo: 60-65% EF with no regional wall motion abnormalities  Reproductive/Obstetrics                            Anesthesia Physical Anesthesia Plan  ASA: III  Anesthesia Plan: Spinal   Post-op Pain Management:    Induction: Intravenous  PONV Risk Score and Plan:   Airway Management Planned: Nasal Cannula  Additional Equipment:   Intra-op Plan:   Post-operative Plan:   Informed Consent: I have reviewed the patients History and Physical, chart, labs and discussed the procedure including the risks, benefits and alternatives for the proposed anesthesia with the patient or authorized representative who has indicated his/her understanding and acceptance.     Dental advisory given  Plan Discussed with: CRNA and Surgeon  Anesthesia Plan Comments:        Anesthesia Quick Evaluation

## 2020-09-22 NOTE — Discharge Instructions (Signed)
Instructions after Total Knee Replacement   Emma Long, Jr., M.D.     Dept. of Orthopaedics & Sports Medicine  [facility]  [address]  [place], [place]  [address]  Phone: [phone]   Fax: [phone]    DIET: . Drink plenty of non-alcoholic fluids. . Resume your normal diet. Include foods high in fiber.  ACTIVITY:  . You may use crutches or a walker with weight-bearing as tolerated, unless instructed otherwise. . You may be weaned off of the walker or crutches by your Physical Therapist.  . Do NOT place pillows under the knee. Anything placed under the knee could limit your ability to straighten the knee.   . Continue doing gentle exercises. Exercising will reduce the pain and swelling, increase motion, and prevent muscle weakness.   . Please continue to use the TED compression stockings for 6 weeks. You may remove the stockings at night, but should reapply them in the morning. . Do not drive or operate any equipment until instructed.  WOUND CARE:  . Continue to use the PolarCare or ice packs periodically to reduce pain and swelling. . You may bathe or shower after the staples are removed at the first office visit following surgery.  MEDICATIONS: . You may resume your regular medications. . Please take the pain medication as prescribed on the medication. . Do not take pain medication on an empty stomach. . You have been given a prescription for a blood thinner (Lovenox or Coumadin). Please take the medication as instructed. (NOTE: After completing a 2 week course of Lovenox, take one Enteric-coated aspirin once a day. This along with elevation will help reduce the possibility of phlebitis in your operated leg.) . Do not drive or drink alcoholic beverages when taking pain medications.  CALL THE OFFICE FOR: . Temperature above 101 degrees . Excessive bleeding or drainage on the dressing. . Excessive swelling, coldness, or paleness of the toes. . Persistent  nausea and vomiting.  FOLLOW-UP:  . You should have an appointment to return to the office in 10-14 days after surgery. . Arrangements have been made for continuation of Physical Therapy (either home therapy or outpatient therapy).     [facility] Department Directory         [email or web address]       [email or web address]          Cardiology  Appointments: [place] - [phone] [place] - [phone]  Endocrinology  Appointments: [place] - [phone] [place] - [phone]  Gastroenterology  Appointments: [place] - [phone] [place] - [phone]        General Surgery   Appointments: [place] - [phone]  Internal Medicine/Family Medicine  Appointments: [place] - [phone] [place] - [phone] [place] - [phone]  Metabolic and Weigh Loss Surgery  Appointments: [place] - [phone]        Neurology  Appointments: [place] - [phone] [place] - [phone]  Neurosurgery  Appointments: [place] - [phone]  Obstetrics & Gynecology  Appointments: [place] - [phone] [place] - [phone]        Pediatrics  Appointments: [place] - [phone] [place] - [phone]  Physiatry  Appointments: [place] -[phone]  Physical Therapy  Appointments: [place] - [phone] [place] - [phone]        Podiatry  Appointments: [place] - [phone] [place] - [phone]  Pulmonology  Appointments: [place] - [phone]  Rheumatology  Appointments:  - [phone]         Location: [facility]  [address] , [place]  [address]  [place] Location: [facility] [address] [place], [place]  [address]  [place] Location: [facility] [address] [place], [place]  [address]      AMBULATORY SURGERY  DISCHARGE INSTRUCTIONS   1) The drugs that you were given will stay in your system until tomorrow so for  the next 24 hours you should not:  A) Drive an automobile B) Make any legal decisions C) Drink any alcoholic beverage   2) You may resume regular meals tomorrow.  Today it is better to start with liquids and gradually work up to solid foods.  You may eat anything you prefer, but it is better to start with liquids, then soup and crackers, and gradually work up to solid foods.   3) Please notify your doctor immediately if you have any unusual bleeding, trouble breathing, redness and pain at the surgery site, drainage, fever, or pain not relieved by medication.    4) Additional Instructions:        Please contact your physician with any problems or Same Day Surgery at [phone], Monday through Friday 6 am to 4 pm, or [facility] number at [phone]. 

## 2020-09-22 NOTE — Anesthesia Procedure Notes (Signed)
Spinal  Patient location during procedure: OR Staffing Performed: resident/CRNA  Anesthesiologist: Yves Dill, MD Resident/CRNA: Berniece Pap, CRNA Preanesthetic Checklist Completed: patient identified, IV checked, site marked, risks and benefits discussed, surgical consent, monitors and equipment checked, pre-op evaluation and timeout performed Spinal Block Patient position: sitting Prep: DuraPrep Patient monitoring: heart rate, cardiac monitor, continuous pulse ox and blood pressure Approach: midline Location: L3-4 Injection technique: single-shot Needle Needle type: Sprotte  Needle gauge: 24 G Needle length: 9 cm Assessment Sensory level: T4 Additional Notes One attempt, patient tolerated procedure well.

## 2020-09-22 NOTE — OR Nursing (Signed)
One screw explanted from left knee. Disposed of in biohazard per MD.

## 2020-09-22 NOTE — Transfer of Care (Signed)
Immediate Anesthesia Transfer of Care Note  Patient: Emma Long  Procedure(s) Performed: COMPUTER ASSISTED TOTAL KNEE ARTHROPLASTY (Left Knee) APPLICATION OF WOUND VAC (Left Knee)  Patient Location: PACU  Anesthesia Type:General  Level of Consciousness: awake and alert   Airway & Oxygen Therapy: Patient Spontanous Breathing  Post-op Assessment: Report given to RN and Post -op Vital signs reviewed and stable  Post vital signs: Reviewed and stable  Last Vitals:  Vitals Value Taken Time  BP 101/59 09/22/20 1434  Temp    Pulse 70 09/22/20 1438  Resp 19 09/22/20 1438  SpO2 97 % 09/22/20 1438  Vitals shown include unvalidated device data.  Last Pain:  Vitals:   09/22/20 1021  TempSrc: Temporal  PainSc: 0-No pain         Complications: No complications documented.

## 2020-09-23 ENCOUNTER — Encounter: Payer: Self-pay | Admitting: Orthopedic Surgery

## 2020-09-25 NOTE — Anesthesia Postprocedure Evaluation (Signed)
Anesthesia Post Note  Patient: Emma Long  Procedure(s) Performed: COMPUTER ASSISTED TOTAL KNEE ARTHROPLASTY (Left Knee) APPLICATION OF WOUND VAC (Left Knee)  Patient location during evaluation: PACU Anesthesia Type: Spinal Level of consciousness: awake and alert and oriented Pain management: pain level controlled Vital Signs Assessment: post-procedure vital signs reviewed and stable Respiratory status: spontaneous breathing Cardiovascular status: blood pressure returned to baseline Anesthetic complications: no   No complications documented.   Last Vitals:  Vitals:   09/22/20 1645 09/22/20 1815  BP:  106/72  Pulse: 72 70  Resp: 16 16  Temp:  (!) 36.4 C  SpO2: 100% 100%    Last Pain:  Vitals:   09/23/20 0843  TempSrc:   PainSc: 3                  CARROLL,PAUL

## 2020-10-21 ENCOUNTER — Other Ambulatory Visit: Payer: Self-pay

## 2020-10-21 ENCOUNTER — Ambulatory Visit
Admission: RE | Admit: 2020-10-21 | Discharge: 2020-10-21 | Disposition: A | Discharge status: Home / Self Care | Payer: 59 | Source: Ambulatory Visit | Attending: Family Medicine | Admitting: Family Medicine

## 2020-10-21 DIAGNOSIS — Z01419 Encounter for gynecological examination (general) (routine) without abnormal findings: Secondary | ICD-10-CM

## 2020-11-11 ENCOUNTER — Encounter: Payer: Self-pay | Admitting: Internal Medicine

## 2020-11-11 ENCOUNTER — Other Ambulatory Visit: Payer: Self-pay

## 2020-11-11 ENCOUNTER — Ambulatory Visit (INDEPENDENT_AMBULATORY_CARE_PROVIDER_SITE_OTHER): Payer: 59 | Admitting: Internal Medicine

## 2020-11-11 VITALS — BP 156/92 | HR 70 | Ht 61.0 in | Wt 164.0 lb

## 2020-11-11 DIAGNOSIS — Z950 Presence of cardiac pacemaker: Secondary | ICD-10-CM

## 2020-11-11 DIAGNOSIS — I442 Atrioventricular block, complete: Secondary | ICD-10-CM

## 2020-11-11 DIAGNOSIS — I498 Other specified cardiac arrhythmias: Secondary | ICD-10-CM

## 2020-11-11 DIAGNOSIS — I495 Sick sinus syndrome: Secondary | ICD-10-CM | POA: Diagnosis not present

## 2020-11-11 LAB — PACEMAKER DEVICE OBSERVATION

## 2020-11-11 NOTE — Progress Notes (Signed)
Patient Care Team: Patient, No Pcp Per (Inactive) as PCP - General (General Practice) Kate Sable, MD as PCP - Cardiology (Cardiology)   HPI  Emma Long is a 44 y.o. female seen to establish pacemaker follow-up.  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  And their devices were rated to no deformation at 7 atm.  She dives to about 200 feet which is about 7 atm  History of SVT for which he underwent a series of ablations at [facility] in the late 1990s, ultimately treated with flecainide  The patient denies chest pain, shortness of breath, nocturnal dyspnea, orthopnea or peripheral edema.  There have been no palpitations, lightheadedness or syncope.   Has just undergone knee replacement surgery.  She is eager to get back to divingHusband is Dealer for [employer], 47 yo twins, [name]   DATE TEST EF   11/21 Echo  60-65%         Date Cr K Hgb  12/21 0.86 4.3 14.5   DATE PR interval QRSduration Dose  11/21 AVp AVp 50   4/22 AVp AVp 50       Records and Results Reviewed   Past Medical History:  Diagnosis Date  . ADHD   . Arrhythmia   . Atrial flutter ([place])   . Chicken pox   . Heart disease 06/30/2020  . Pacemaker 02/1999  . Pain in left knee 03/06/2018  . Sinoatrial node dysfunction (HCC)     Past Surgical History:  Procedure Laterality Date  . APPLICATION OF WOUND VAC Left 09/22/2020   Procedure: APPLICATION OF WOUND VAC;  Surgeon: Dereck Leep, MD;  Location: [facility] ORS;  Service: Orthopedics;  Laterality: Left;  . ARTHROSCOPIC REPAIR ACL Left 1996  . BILATERAL CARPAL TUNNEL RELEASE Bilateral 2007  . CARDIAC ELECTROPHYSIOLOGY STUDY AND ABLATION  1996-1999   x4  . HYSTEROSCOPY  2004   essure placement  . INSERT / REPLACE / REMOVE PACEMAKER  02/1999   SA node blocked from scar tissue formed after 4th  ablation  . KNEE ARTHROPLASTY Left 09/22/2020   Procedure: COMPUTER ASSISTED TOTAL KNEE ARTHROPLASTY;  Surgeon: Dereck Leep, MD;  Location: [facility] ORS;  Service: Orthopedics;  Laterality: Left;  . LIPOMA EXCISION  2001   left scapula  . TONSILLECTOMY AND ADENOIDECTOMY  1992    Current Meds  Medication Sig  . amphetamine-dextroamphetamine (ADDERALL) 20 MG tablet Take 20 mg by mouth 2 (two) times daily as needed (attention/focus).  Marland Kitchen ascorbic acid (VITAMIN C) 1000 MG tablet Take 1,000 mg by mouth daily.  . celecoxib (CELEBREX) 200 MG capsule Take 1 capsule (200 mg total) by mouth 2 (two) times daily.  . cholecalciferol (VITAMIN D) 25 MCG (1000 UNIT) tablet Take 3,000-5,000 Units by mouth daily.  . diclofenac Sodium (VOLTAREN) 1 % GEL Apply 1 application topically daily. Knee pain.  . flecainide (TAMBOCOR) 50 MG tablet Take 50 mg by mouth daily.  Marland Kitchen lidocaine-prilocaine (EMLA) cream Apply 1 application topically 2 (two) times daily as needed (plantar fasciitis pain.).  Marland Kitchen metoprolol succinate (TOPROL-XL) 25 MG 24 hr tablet Take 1 tablet (25 mg total) by mouth daily.  Marland Kitchen VYVANSE 70 MG capsule Take 70 mg by mouth daily.  . Zinc 50 MG TABS Take 50 mg by mouth daily.    Allergies  Allergen Reactions  . Iodinated  Diagnostic Agents Itching and Other (See Comments)    Itchy throat, sneezing,abdominal pain   . Other Other (See Comments)    Vicryl sutures- dishiscence  . Tape Rash    Paper tape  . Wound Dressing Adhesive Rash      Review of Systems negative except from HPI and PMH  Physical Exam BP (!) 156/92   Pulse 70   Ht '5\' 1"'  (1.549 m)   Wt 164 lb (74.4 kg)   BMI 30.99 kg/m  Well developed and well nourished in no acute distress HENT normal E scleral and icterus clear Neck Supple JVP flat; carotids brisk and full Clear to ausculation Regular rate and rhythm, no murmurs gallops or rub Soft with active bowel sounds No clubbing cyanosis  Edema Alert and oriented, grossly  normal motor and sensory function Skin Warm and Dry  ECG AV pacing  CrCl cannot be calculated (Patient's most recent lab result is older than the maximum 21 days allowed.).   Assessment and  Plan Complete heart block-intermittent  Pacemaker-[employer] Jude initially implanted for sinus node dysfunction  Atrial arrhythmias-recurrent on flecainide    Tolerating flecainide remains on low-dose  Not withstanding prolonged ventricular pacing, LV function remains normal.  The issue is begged however of her history as to why a lady who had sinus node dysfunction attributed to repeated ablation in her atrium should develop complete heart block.  Is there a unifying process and gave rise to atriopathy and has arrhythmias as well as complete heart block.  Sarcoid could possibly do it and with her device being a [employer] of  leads and generators, we will undertake cardiac PET  Reprogrammed device to maximize longevity  When it comes time to replace devices, battery longevity coupled with pressure with standing characteristics should inform decision making      Current medicines are reviewed at length with the patient today .  The patient does not  have concerns regarding medicines.

## 2020-11-11 NOTE — Patient Instructions (Addendum)
Medication Instructions:  - Your physician recommends that you continue on your current medications as directed. Please refer to the Current Medication list given to you today.  *If you need a refill on your cardiac medications before your next appointment, please call your pharmacy*   Lab Work: - none ordered  If you have labs (blood work) drawn today and your tests are completely normal, you will receive your results only by: Marland Kitchen MyChart Message (if you have MyChart) OR . A paper copy in the mail If you have any lab test that is abnormal or we need to change your treatment, we will call you to review the results.   Testing/Procedures:  1) Cardiac PET scan- to be arranged at [facility]  - this will go through a precertification process, then we will contact [facility] to arrange - we will call you with an appointment date/ time - the [facility] testing area will contact you 2 days ahead of the appointment to go over your dietary instructions and where to go   Follow-Up: At [facility], you and your health needs are our priority.  As part of our continuing mission to provide you with exceptional heart care, we have created designated Provider Care Teams.  These Care Teams include your primary Cardiologist (physician) and Advanced Practice Providers (APPs -  Physician Assistants and Nurse Practitioners) who all work together to provide you with the care you need, when you need it.  We recommend signing up for the patient portal called "MyChart".  Sign up information is provided on this After Visit Summary.  MyChart is used to connect with patients for Virtual Visits (Telemedicine).  Patients are able to view lab/test results, encounter notes, upcoming appointments, etc.  Non-urgent messages can be sent to your provider as well.   To learn more about what you can do with MyChart, go to [email or web address].    Your next appointment:   1) 6 months with the Device Clinic in [place] (if your remote  monitor is not in place by then  2) 1 year with Dr. Graciela Husbands in [place]   The format for your next appointment:   In Person  Provider:   Sherryl Manges, MD   Other Instructions n/a

## 2020-12-01 ENCOUNTER — Telehealth: Payer: Self-pay | Admitting: Internal Medicine

## 2020-12-01 NOTE — Telephone Encounter (Signed)
Patient calling  Patient wants to check on status of PET scan from [facility] - she has yet to hear from them for scheduling  Patient is also having arrhythmias for the first time in a long time  Please call to discuss

## 2020-12-01 NOTE — Telephone Encounter (Signed)
Still waiting to see if this has been authorized.  Please advise so I can send her orders to have her scheduled.  Thank you!

## 2020-12-01 NOTE — Telephone Encounter (Signed)
Abdul-Razzaaq, Cline Cools, RN Ok, Thanks so much        Previous Messages   ----- Message -----  From: Jefferey Pica, RN  Sent: 11/13/2020  3:26 PM EDT  To: Francine Graven   The paper order from [facility] has a CPT code of: 76720  ----- Message -----  From: Francine Graven  Sent: 11/13/2020  3:07 PM EDT  To: Jefferey Pica, RN   Good afternoon,   I don't see any orders for this procedure yet. Without that info I can't move forward with getting an auth.  ----- Message -----  From: Jefferey Pica, RN  Sent: 11/13/2020  1:01 PM EDT  To: Jefferey Pica, RN, *   This patient is being referred to [facility] for a Myocardial Sarcoid PET scan.  Dx- complete heart block   I will need to get the authorization information on this prior to sending orders to schedule.   Thank you!

## 2020-12-02 NOTE — Telephone Encounter (Signed)
I spoke with the patient. I have advised her that authorization for her Cardiac PET scan is still pending as of this morning.   I have advised her I cannot call the [facility] testing area to schedule this/ send her paperwork until her authorization is obtained.  She is aware I will send this as soon as I have received this.   I have also discussed with her the increase in arrhythmias that she has been experiencing. Per the patient, this has been going on for the last few weeks/ at least since she was in the office and her device was re-programmed on 11/11/20. She notices "skips" or "missed beats" almost constantly throughout the day.   She denies dizziness/ lightheadedness, but is having more fatigue.   I have confirmed with her that her remote monitoring has been established.   The patient is aware I will forward a message to our device clinic nurses to please review and touch base with her as symptoms are associated with re-programming of her device.  The patient voices understanding of all of the above and is agreeable.

## 2020-12-02 NOTE — Telephone Encounter (Signed)
Secure chat message received this morning from Anisah Abdul-Razzaaq (pre-cert) as stated below: Good Morning, I've submitted the auth for [id number]. It is in clinical review. Will keep you posted

## 2020-12-03 ENCOUNTER — Ambulatory Visit (INDEPENDENT_AMBULATORY_CARE_PROVIDER_SITE_OTHER): Payer: 59

## 2020-12-03 DIAGNOSIS — I442 Atrioventricular block, complete: Secondary | ICD-10-CM

## 2020-12-03 NOTE — Telephone Encounter (Signed)
Noted- the patient has an appointment with Francis Dowse, PA (EP) on 12/09/20.

## 2020-12-03 NOTE — Telephone Encounter (Signed)
Programming changes made at last appointment were to change RA/RV outputs were changed to RA 1.5V @ 0.6 ms and RV 2 V @ 0.8 ms per SK. Changes made do not affect the timing of device function and would not be R/T patient's s/sx. Patient contacted and describes s/sx of intermittent palpitations and feeling her pulse skip. She reports SOB with activity and fatigue after the episodes start. She reports increased episodes and frequency that resemble episodes she had 20 years ago. Home remote transmission sent and reviewed, device function WNL, AT/AF burden < 1 %, 30 AMS episodes with 25 EGMs  from 4/6- 425/22 that show AT/AF episodes , longest episode 22 seconds. Patient is taking flecainide 50 mg daily  And Toprol XL 25 mg daily. Will schedule with EP AP to evaluate.

## 2020-12-06 NOTE — Telephone Encounter (Signed)
Noted  

## 2020-12-07 NOTE — Progress Notes (Signed)
Cardiology Office Note Date:  12/07/2020  Patient ID:  Emma Long, Emma Long 1976-06-28, MRN [id number] PCP:  Patient, No Pcp Per (Inactive)  Cardiologist:  Kate Sable, MD  Electrophysiologist: Dr. Caryl Comes   Chief Complaint: increase in palpitations  History of Present Illness: Emma Long is a 45 y.o. female with history of SVT (underwent a series of ablations at [facility] in the late 1990s, ultimately treated with flecainide), CHB w/PPM a week or so after her last ablation.  She comes in today to be seen for Dr. Caryl Comes, last seen by him 11/11/20, doing well, planned for Cardiac PET given advanced heart block in young patient.  Her lead outputs were adjusted for better longevity.  She reported since then that she has had some palpitations, a transmission sent, device RN noted intact function and AMS episodes reported as AF/AT, pt was given an appt to be seen given flecainide  TODAY She reports since her last visit she has had a clear up-tick in palpitations, some are a sense of extra beats some a sense of skipped/missing beats.  There are fairly fleeting but frequent and interrupting her now, very aware of them and can make her feel a bit poorly. No near syncope or syncope. No CP or SOB Reminds her of 20 years ago when she was having her tachycardias and got her pacemaker.    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  And their devices were rated to no deformation at 7 atm.  She dives to about 200 feet which is about 7 atm  She has MDT leads and SJM can, system is NOT MRI compatible    Past Medical History:  Diagnosis Date  . ADHD   . Arrhythmia   . Atrial flutter  ([place])   . Chicken pox   . Heart disease 06/30/2020  . Pacemaker 02/1999  . Pain in left knee 03/06/2018  . Sinoatrial node dysfunction (HCC)     Past Surgical History:  Procedure Laterality Date  . APPLICATION OF WOUND VAC Left 09/22/2020   Procedure: APPLICATION OF WOUND VAC;  Surgeon: Dereck Leep, MD;  Location: [facility] ORS;  Service: Orthopedics;  Laterality: Left;  . ARTHROSCOPIC REPAIR ACL Left 1996  . BILATERAL CARPAL TUNNEL RELEASE Bilateral 2007  . CARDIAC ELECTROPHYSIOLOGY STUDY AND ABLATION  1996-1999   x4  . HYSTEROSCOPY  2004   essure placement  . INSERT / REPLACE / REMOVE PACEMAKER  02/1999   SA node blocked from scar tissue formed after 4th ablation  . KNEE ARTHROPLASTY Left 09/22/2020   Procedure: COMPUTER ASSISTED TOTAL KNEE ARTHROPLASTY;  Surgeon: Dereck Leep, MD;  Location: [facility] ORS;  Service: Orthopedics;  Laterality: Left;  . LIPOMA EXCISION  2001   left scapula  . TONSILLECTOMY AND ADENOIDECTOMY  1992    Current Outpatient Medications  Medication Sig Dispense Refill  . amphetamine-dextroamphetamine (ADDERALL) 20 MG tablet Take 20 mg by mouth 2 (two) times daily as needed (attention/focus).    Marland Kitchen ascorbic acid (VITAMIN C) 1000 MG tablet Take 1,000 mg by mouth daily.    . celecoxib (CELEBREX) 200 MG capsule Take 1 capsule (200 mg total) by mouth 2 (two) times  daily. 60 capsule 2  . cholecalciferol (VITAMIN D) 25 MCG (1000 UNIT) tablet Take 3,000-5,000 Units by mouth daily.    . diclofenac Sodium (VOLTAREN) 1 % GEL Apply 1 application topically daily. Knee pain.    . flecainide (TAMBOCOR) 50 MG tablet Take 50 mg by mouth daily.    Marland Kitchen lidocaine-prilocaine (EMLA) cream Apply 1 application topically 2 (two) times daily as needed (plantar fasciitis pain.).    Marland Kitchen metoprolol succinate (TOPROL-XL) 25 MG 24 hr tablet Take 1 tablet (25 mg total) by mouth daily. 30 tablet 5  . VYVANSE 70 MG capsule Take 70 mg by mouth daily.    . Zinc 50 MG TABS Take 50 mg by mouth daily.      No current facility-administered medications for this visit.    Allergies:   Iodinated diagnostic agents, Other, Tape, and Wound dressing adhesive   Social History:  The patient  reports that she has never smoked. She has never used smokeless tobacco. She reports previous alcohol use. She reports that she does not use drugs.   Family History:  The patient's family history includes Breast cancer in her mother; Hypertension in her father and mother; Ovarian cysts in her sister; Parkinson's disease in her father; Prostate cancer in her father.  ROS:  Please see the history of present illness.    All other systems are reviewed and otherwise negative.   PHYSICAL EXAM:  VS:  There were no vitals taken for this visit. BMI: There is no height or weight on file to calculate BMI. Well nourished, well developed, in no acute distress HEENT: normocephalic, atraumatic Neck: no JVD, carotid bruits or masses Cardiac:  RRR; no significant murmurs, no rubs, or gallops Lungs:   CTA b/l, no wheezing, rhonchi or rales Abd: soft, nontender MS: no deformity or atrophy Ext: no edema Skin: warm and dry, no rash Neuro:  No gross deficits appreciated Psych: euthymic mood, full affect  PPM site is stable, no tethering or discomfort   EKG:  Done today and reviewed by myself shows  AV paced 70bpm  Device interrogation done today and reviewed by myself:  AMS noted, all available EGMs are reveiwed Majority are A lead noise and inappropriate AMS, some are very fleeting ST/AT 150's 4-5 beats only All together only a burden of <1% RV lead thresholds today 1.75/1.56m and 2.0/0.8 Output adjusted to 3.5/1.0 Battery longevity estimate initially was 3.1-3.4 years With programming changes reduced to 1.8-2.1 years  07/08/2020: TTE IMPRESSIONS  1. Left ventricular ejection fraction, by estimation, is 60 to 65%. Left  ventricular ejection fraction by 3D volume is 64 %. The left ventricle has  normal  function. The left ventricle has no regional wall motion  abnormalities. There is mild left  ventricular hypertrophy. Left ventricular diastolic parameters were  normal.  2. Right ventricular systolic function is normal. The right ventricular  size is normal. There is normal pulmonary artery systolic pressure.  3. The mitral valve is normal in structure. No evidence of mitral valve  regurgitation.  4. The aortic valve is tricuspid. Aortic valve regurgitation is not  visualized.  5. The inferior vena cava is normal in size with greater than 50%  respiratory variability, suggesting right atrial pressure of 3 mmHg.     Recent Labs: 07/28/2020: TSH 1.450 09/05/2020: ALT 19; BUN 28; Creatinine, Ser 0.71; Hemoglobin 12.5; Platelets 299; Potassium 3.9; Sodium 140  07/28/2020: Chol/HDL Ratio 3.4; Cholesterol, Total 245; HDL 73; LDL Chol Calc (NIH) 145; Triglycerides 155  CrCl cannot be calculated (Patient's most recent lab result is older than the maximum 21 days allowed.).   Wt Readings from Last 3 Encounters:  11/11/20 164 lb (74.4 kg)  09/22/20 163 lb (73.9 kg)  09/05/20 165 lb (74.8 kg)     Other studies reviewed: Additional studies/records reviewed today include: summarized above  ASSESSMENT AND PLAN:  1. PPM      Today's interrogation she did not enough P waves underlying to get a measurement, last were small 0.74mshe has noise on her A lead, this by her account is not new and recalls the discussion about this for some years. Impedance is stable  RV lead: threshold done today several times 1.5-2.0V/0.879m checked a number of times 1.5-1.75V/1.99m62mchecked a number of times Confirmed by loss of pulse with loss of capture  She has a V escape rate today 30's   I suspect she may have had intermittent loss of capture potentially The burden of her A leas nose and rare short PATs I don't think account for her symptoms, and by her prior interrogation was happening previously  as well (308AMS at that time)  While her battery longevity nearly cut in 1/2 2:1 safety margin given her very slow underlying escape rate Is needed.  I have discussed my findings with Dr. LamQuentin Oreith A lead noise and fairly high RV thresholds, she will ikely benefit from extraction and now system implant when battery reaches ERI Will have her see Dr. LamQuentin Orer consult/evaluation  2. SVT     Hx of a number of prior ablations     Flecainide and metoprolol with stable/paced intervals     Very short PATs/V paced and rare  3. Palpitations     Discussed above  Disposition: F/u with Dr. LamQuentin Ore his next available to recheck her thresholds and discuss device management strategies.  Current medicines are reviewed at length with the patient today.  The patient did not have any concerns regarding medicines.  SigVenetia NightA-C 12/07/2020 4:41 PM     [facility] [phone])  ([phone])

## 2020-12-09 ENCOUNTER — Ambulatory Visit (INDEPENDENT_AMBULATORY_CARE_PROVIDER_SITE_OTHER): Payer: 59 | Admitting: Physician Assistant

## 2020-12-09 ENCOUNTER — Other Ambulatory Visit: Payer: Self-pay

## 2020-12-09 ENCOUNTER — Encounter: Payer: Self-pay | Admitting: Physician Assistant

## 2020-12-09 VITALS — BP 134/86 | HR 69 | Ht 61.0 in | Wt 164.4 lb

## 2020-12-09 DIAGNOSIS — I471 Supraventricular tachycardia, unspecified: Secondary | ICD-10-CM

## 2020-12-09 DIAGNOSIS — Z5181 Encounter for therapeutic drug level monitoring: Secondary | ICD-10-CM

## 2020-12-09 DIAGNOSIS — I519 Heart disease, unspecified: Secondary | ICD-10-CM

## 2020-12-09 DIAGNOSIS — Z950 Presence of cardiac pacemaker: Secondary | ICD-10-CM | POA: Diagnosis not present

## 2020-12-09 DIAGNOSIS — Z79899 Other long term (current) drug therapy: Secondary | ICD-10-CM | POA: Diagnosis not present

## 2020-12-09 LAB — CUP PACEART REMOTE DEVICE CHECK
Battery Remaining Longevity: 39 mo
Battery Remaining Percentage: 45 %
Battery Voltage: 2.86 V
Brady Statistic AP VP Percent: 97 %
Brady Statistic AP VS Percent: 1 %
Brady Statistic AS VP Percent: 3.3 %
Brady Statistic AS VS Percent: 1 %
Brady Statistic RA Percent Paced: 97 %
Brady Statistic RV Percent Paced: 99 %
Date Time Interrogation Session: 20220427081332
Implantable Lead Implant Date: 20150114
Implantable Lead Implant Date: 20150114
Implantable Lead Location: 753859
Implantable Lead Location: 753860
Implantable Lead Model: 5068
Implantable Pulse Generator Implant Date: 20150114
Lead Channel Impedance Value: 380 Ohm
Lead Channel Impedance Value: 510 Ohm
Lead Channel Pacing Threshold Amplitude: 0.5 V
Lead Channel Pacing Threshold Amplitude: 1 V
Lead Channel Pacing Threshold Pulse Width: 0.8 ms
Lead Channel Pacing Threshold Pulse Width: 0.8 ms
Lead Channel Sensing Intrinsic Amplitude: 0.5 mV
Lead Channel Sensing Intrinsic Amplitude: 4.7 mV
Lead Channel Setting Pacing Amplitude: 1.5 V
Lead Channel Setting Pacing Amplitude: 2 V
Lead Channel Setting Pacing Pulse Width: 0.8 ms
Lead Channel Setting Sensing Sensitivity: 0.5 mV
Pulse Gen Model: 2240
Pulse Gen Serial Number: 7576209

## 2020-12-09 LAB — CUP PACEART INCLINIC DEVICE CHECK
Battery Remaining Longevity: 21 mo
Battery Voltage: 2.86 V
Brady Statistic RA Percent Paced: 96 %
Brady Statistic RV Percent Paced: 99.98 %
Date Time Interrogation Session: 20220503163038
Implantable Lead Implant Date: 20150114
Implantable Lead Implant Date: 20150114
Implantable Lead Location: 753859
Implantable Lead Location: 753860
Implantable Lead Model: 5068
Implantable Pulse Generator Implant Date: 20150114
Lead Channel Impedance Value: 387.5 Ohm
Lead Channel Impedance Value: 537.5 Ohm
Lead Channel Pacing Threshold Amplitude: 0.75 V
Lead Channel Pacing Threshold Amplitude: 0.75 V
Lead Channel Pacing Threshold Amplitude: 1.75 V
Lead Channel Pacing Threshold Amplitude: 1.75 V
Lead Channel Pacing Threshold Pulse Width: 0.6 ms
Lead Channel Pacing Threshold Pulse Width: 0.6 ms
Lead Channel Pacing Threshold Pulse Width: 1 ms
Lead Channel Pacing Threshold Pulse Width: 1 ms
Lead Channel Sensing Intrinsic Amplitude: 0.4 mV
Lead Channel Sensing Intrinsic Amplitude: 6.6 mV
Lead Channel Setting Pacing Amplitude: 1.5 V
Lead Channel Setting Pacing Amplitude: 3.5 V
Lead Channel Setting Pacing Pulse Width: 1 ms
Lead Channel Setting Sensing Sensitivity: 0.5 mV
Pulse Gen Model: 2240
Pulse Gen Serial Number: 7576209

## 2020-12-09 NOTE — Patient Instructions (Signed)
Medication Instructions:   Your physician recommends that you continue on your current medications as directed. Please refer to the Current Medication list given to you today.  *If you need a refill on your cardiac medications before your next appointment, please call your pharmacy*   Lab Work: NONE ORDERED  TODAY    If you have labs (blood work) drawn today and your tests are completely normal, you will receive your results only by: Marland Kitchen MyChart Message (if you have MyChart) OR . A paper copy in the mail If you have any lab test that is abnormal or we need to change your treatment, we will call you to review the results.   Testing/Procedures: NONE ORDERED  TODAY     Follow-Up: At [facility], you and your health needs are our priority.  As part of our continuing mission to provide you with exceptional heart care, we have created designated Provider Care Teams.  These Care Teams include your primary Cardiologist (physician) and Advanced Practice Providers (APPs -  Physician Assistants and Nurse Practitioners) who all work together to provide you with the care you need, when you need it.  We recommend signing up for the patient portal called "MyChart".  Sign up information is provided on this After Visit Summary.  MyChart is used to connect with patients for Virtual Visits (Telemedicine).  Patients are able to view lab/test results, encounter notes, upcoming appointments, etc.  Non-urgent messages can be sent to your provider as well.   To learn more about what you can do with MyChart, go to [email or web address].    Your next appointment:   1 month(s) (CONTACT [facility] FOR ANY SCHEDULING ISSUES )  The format for your next appointment:   In Person  Provider:   Steffanie Dunn, MD only    Other Instructions

## 2020-12-16 NOTE — Telephone Encounter (Signed)
Hepler, Vennie Homans, Jelani Vreeland C, RN; Abdul-Razzaaq, Anisah M; P Cv Div Heartcare Pre Cert/Auth Yes, the patient is good to go. I emailed you the [facility] information so that you can send that with the fax.   Thanks,   Marchelle Folks        Previous Messages   ----- Message -----  From: Jefferey Pica, RN  Sent: 12/16/2020 10:11 AM EDT  To: Florina Ou, Francine Graven, *   Thank you Marchelle Folks!   So it looks like I'm ok to go ahead and try to schedule this for her, correct?   ----- Message -----  From: Florina Ou  Sent: 12/16/2020  9:14 AM EDT  To: Jefferey Pica, RN, Claudean Severance Abdul-Razzaaq, *   Good morning,   Hermine Messick is off today. I will email you the information from the [facility] website.   Thanks,   Marchelle Folks  ----- Message -----  From: Jefferey Pica, RN  Sent: 12/16/2020  8:45 AM EDT  To: Jefferey Pica, RN, Francine Graven, *   Checking on status of the authorization so I can fax her orders to [facility] to schedule.

## 2020-12-16 NOTE — Telephone Encounter (Signed)
Order/ copy of the authorization/ paperwork all faxed to [facility] PET Imaging Dept.  Faxed to [phone] Confirmation received.   Awaiting a call back from the Imaging Dept with an appointment for the patient.

## 2020-12-17 NOTE — Telephone Encounter (Signed)
Patient calling in to make office aware the Emma Long has been received and is valid until 6/16

## 2020-12-18 ENCOUNTER — Telehealth: Payer: Self-pay | Admitting: Internal Medicine

## 2020-12-18 NOTE — Telephone Encounter (Signed)
Vonna Kotyk calling to update Patient is scheduled for June 27th at 12:45p He saw the diabetes mentioned as a factor in her ECHO report  Please call if needed

## 2020-12-18 NOTE — Telephone Encounter (Signed)
Emma Long from [facility] PET imaging calling  States that they received the request for test but in the notes it says she is diabetic  Needs to know if she takes insulin - unable to find any info on this  If so, they will be unable to do test  Please call Emma Long at [phone]

## 2020-12-18 NOTE — Telephone Encounter (Signed)
I spoke with Vonna Kotyk at [facility] PET Imaging. I advised him that I do not see where the patient is diabetic. I do not see this listed in any notes from Dr. Graciela Husbands. She is not on any diabetic meds.  Per Vonna Kotyk, she saw a mention of diabetes on her records that were sent over. I asked him where he saw this in the notes. He attempted to pull the notes, but could not locate where diabetes was mentioned right off hand.  Vonna Kotyk advised as long as she is not insulin dependent, this was not an issue. I advised she is on no diabetic meds/ I do not see diabetes listed for her.  Vonna Kotyk states they will proceed with scheduling and someone from their department will call me back with that appointment so I may notify the patient.

## 2020-12-19 NOTE — Progress Notes (Signed)
Remote pacemaker transmission.   

## 2020-12-19 NOTE — Telephone Encounter (Signed)
MyChart message sent to the patient with the date/ time of her Myocardial Sarcoid PET scan. I have given her the contact # for [facility] to call if she needs to cancel or reschedule her test. I have advised her that [facility] will call her 1-3 days prior to her test with any pre test instructions and where to go for the test.

## 2020-12-19 NOTE — Telephone Encounter (Signed)
Patient's test has been scheduled for 02/02/21- see 5/12 phone note.

## 2020-12-30 ENCOUNTER — Telehealth: Payer: Self-pay

## 2020-12-30 NOTE — Telephone Encounter (Signed)
Call received back from the [facility] PET department. They advised they do not think the patient's scan can be done any sooner, but I was directed to the supervisor, Jay's, voice mail. I have left a detailed message for Vonna Kotyk to please call back to see if the scan can be done any sooner then 6/16 due to the patient's insurance authorization running out.   I am awaiting a call back.

## 2020-12-30 NOTE — Telephone Encounter (Signed)
I called and left a message at the [facility] PET imaging center to please call me back to try to reschedule the patient's myocardial PET sarcoid study for prior to 02/02/21 as her insurance authorization runs out on 01/22/21.  I had to leave a voice mail at [facility] asking them to please call back with a date of testing prior to 01/22/21.  I have also notified the patient that her appointment was scheduled for 02/02/21 and sent to her MyChart on 5/13, however, I have called the [facility] PET department and left a message for them to please call me back to try to reschedule for prior to 01/22/21.

## 2020-12-30 NOTE — Telephone Encounter (Signed)
The patient states she Emma Long had schedule her a pet scan with [facility]. She said the insurance authorization runs out for it in a couple of weeks. Do she needs to call the insurance about it or do we contact them. I told her I will have [name] nurse give her a call about that.

## 2021-01-02 NOTE — Telephone Encounter (Signed)
Staff message sent to precert department today:  Jefferey Pica, RN  Florina Ou; Abdul-Razzaaq, Claudean Severance; P C[facility] Div Heartcare Pre Cert/Auth; Jefferey Pica, RN Hey there,   [facility] called me back with an appointment for the patient's myocardial pet sarcoid study, but they cannot get her in until 02/02/21. I spoke with their supervisor this week to try to move this up because her current authorization runs out on 01/22/21. Per the PET supervisor, they have nothing prior to 02/02/21- is there a way to try to get an extension on the authorization from her insurance?   Thank you!  Herbert Seta, RN

## 2021-01-02 NOTE — Telephone Encounter (Signed)
Per Staff message received from precert:  Abdul-Razzaaq, Cline Cools, RN Good afternoon,   Unfortunately, [facility] doesn't extend their auths. I will have to wait until it expires to submit another one.

## 2021-01-02 NOTE — Telephone Encounter (Signed)
I called and spoke with the patient. I have advised her that I have spoken with the supervisor, Vonna Kotyk, at the PET imaging department at [facility] and he advised there are no sooner openings than 02/02/21 for the patient to have her test done.  She is aware our precert department will be working with her insurance to obtain another authorization to cover her until 02/02/21 for the scan.  I have advised her I will not call her back unless there is an issue with her authorization. Otherwise her test will be on 02/02/21 at 12:45 pm. She is aware the testing department will call her 1-3 days prior to her test to go over any instructions and tell her exactly where to come for the test.  The patient voices understanding and is agreeable.

## 2021-01-07 ENCOUNTER — Encounter: Payer: Self-pay | Admitting: Cardiology

## 2021-01-07 ENCOUNTER — Ambulatory Visit (INDEPENDENT_AMBULATORY_CARE_PROVIDER_SITE_OTHER): Payer: 59 | Admitting: Cardiology

## 2021-01-07 ENCOUNTER — Other Ambulatory Visit: Payer: Self-pay

## 2021-01-07 VITALS — BP 134/92 | HR 70 | Ht 61.0 in | Wt 165.2 lb

## 2021-01-07 DIAGNOSIS — T82110A Breakdown (mechanical) of cardiac electrode, initial encounter: Secondary | ICD-10-CM

## 2021-01-07 DIAGNOSIS — I471 Supraventricular tachycardia, unspecified: Secondary | ICD-10-CM

## 2021-01-07 DIAGNOSIS — I442 Atrioventricular block, complete: Secondary | ICD-10-CM

## 2021-01-07 DIAGNOSIS — I495 Sick sinus syndrome: Secondary | ICD-10-CM | POA: Diagnosis not present

## 2021-01-07 MED ORDER — PREDNISONE 50 MG PO TABS: ORAL_TABLET | ORAL | 0 refills | Status: DC

## 2021-01-07 NOTE — Patient Instructions (Addendum)
Medication Instructions:  Your physician recommends that you continue on your current medications as directed. Please refer to the Current Medication list given to you today. *If you need a refill on your cardiac medications before your next appointment, please call your pharmacy*  Lab Work: You will get lab work today:  BMP  If you have labs (blood work) drawn today and your tests are completely normal, you will receive your results only by: Marland Kitchen MyChart Message (if you have MyChart) OR . A paper copy in the mail If you have any lab test that is abnormal or we need to change your treatment, we will call you to review the results.  Testing/Procedures: You will be scheduled for a cardiac CT  Follow-Up: At [facility], you and your health needs are our priority.  As part of our continuing mission to provide you with exceptional heart care, we have created designated Provider Care Teams.  These Care Teams include your primary Cardiologist (physician) and Advanced Practice Providers (APPs -  Physician Assistants and Nurse Practitioners) who all work together to provide you with the care you need, when you need it.  Your next appointment:   Your physician wants you to follow-up in: 6-8 weeks with Dr. Lalla Brothers.     February 18, 2021 at 9:40 am at the [facility] office

## 2021-01-07 NOTE — Progress Notes (Signed)
Electrophysiology Office Follow up Visit Note:    Date:  01/07/2021   ID:  Emma Long, DOB 05/07/1976, MRN [id number]  PCP:  Patient, No Pcp Per (Inactive)  [facility] Cardiologist:  Debbe Odea, MD  [facility] Electrophysiologist: Berton Mount, MD   Interval History:    Emma Long is a 45 y.o. female who presents for a follow up visit. They were last seen in clinic by Dr. Graciela Husbands November 11, 2020.  Patient has a history of SVT and underwent a series of ablations at [facility] over 2 decades ago.  Reportedly, during the last ablation, ablation was performed of the SA node.  Shortly after the ablation was completed, the patient developed significant sinus node dysfunction and required permanent pacemaker implant.  Since pacemaker implant, the patient has developed intermittent complete heart block.  Since pacemaker implant, she has had several generator changes, most recently 2015.  At the last appointment with Dr. Graciela Husbands, he raised the question of why she developed intermittent complete heart block with no apparent previous ablation in this region.  He raises question whether or not something such as cardiac sarcoidosis could be contributing and ordered a cardiac PET.  This is pending and scheduled for the coming month.  The patient was seen in follow-up by Francis Dowse, PA-C on Dec 09, 2020.  That follow-up appointment was in the setting of palpitations.  The patient sent in a remote transmission from her device.  Given the patient's ongoing treatment with flecainide an appointment scheduled with her Renee.  At that appointment she reported bursts of tachycardia and skipped beats which made her feel poorly.  No syncope or presyncope.  Her device was interrogated at that appointment and showed atrial lead noise and inappropriate mode switching.  The RV lead threshold was also elevated at 1.75 at 1 ms.  Her battery longevity is estimated to be 1.8 at that appointment.  Her  ventricular escape was in the 30s.  Given the elevated RV threshold and atrial lead noise in the setting of a pacemaker generator within the last year or so of life, question was raised whether or not it was a good time to consider system extraction and reimplant.  Today she tells me she is doing well.  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      Past Medical History:  Diagnosis Date  . ADHD   . Arrhythmia   . Atrial flutter (HCC)   . Chicken pox   . Heart disease 06/30/2020  . Pacemaker 02/1999  . Pain in left knee 03/06/2018  . Sinoatrial node dysfunction (HCC)     Past Surgical History:  Procedure Laterality Date  . APPLICATION OF WOUND VAC Left 09/22/2020   Procedure: APPLICATION OF WOUND VAC;  Surgeon: Donato Heinz, MD;  Location: [facility] ORS;  Service: Orthopedics;  Laterality: Left;  . ARTHROSCOPIC REPAIR ACL Left 1996  . BILATERAL CARPAL TUNNEL RELEASE Bilateral 2007  . CARDIAC ELECTROPHYSIOLOGY STUDY AND ABLATION  1996-1999   x4  . HYSTEROSCOPY  2004   essure placement  . INSERT / REPLACE / REMOVE PACEMAKER  02/1999   SA node blocked from scar tissue formed after 4th ablation  . KNEE ARTHROPLASTY Left 09/22/2020   Procedure: COMPUTER ASSISTED TOTAL KNEE ARTHROPLASTY;  Surgeon: Donato Heinz, MD;  Location: [facility] ORS;  Service: Orthopedics;  Laterality: Left;  . LIPOMA EXCISION  2001   left scapula  . TONSILLECTOMY  AND ADENOIDECTOMY  1992    Current Medications: Current Meds  Medication Sig  . amphetamine-dextroamphetamine (ADDERALL) 20 MG tablet Take 20 mg by mouth 2 (two) times daily as needed (attention/focus).  Marland Kitchen ascorbic acid (VITAMIN C) 1000 MG tablet Take 1,000 mg by mouth daily.  . cholecalciferol (VITAMIN D) 25 MCG (1000 UNIT) tablet Take 3,000-5,000 Units by mouth daily.  . diclofenac Sodium (VOLTAREN) 1 % GEL Apply 1 application topically daily. Knee pain.  . flecainide (TAMBOCOR) 50 MG tablet Take 50 mg by mouth  daily.  Marland Kitchen lidocaine-prilocaine (EMLA) cream Apply 1 application topically 2 (two) times daily as needed (plantar fasciitis pain.).  Marland Kitchen metoprolol succinate (TOPROL-XL) 25 MG 24 hr tablet Take 1 tablet (25 mg total) by mouth daily.  . predniSONE (DELTASONE) 50 MG tablet Take 1 tablet PO 13 hours, 7 hours and 1 hour prior to cardiac CT  . VYVANSE 70 MG capsule Take 70 mg by mouth daily.  . Zinc 50 MG TABS Take 50 mg by mouth daily.     Allergies:   Iodinated diagnostic agents, Other, Tape, and Wound dressing adhesive   Social History   Socioeconomic History  . Marital status: Married    Spouse name: Molly Maduro  . Number of children: 2  . Years of education: Not on file  . Highest education level: Not on file  Occupational History  . Not on file  Tobacco Use  . Smoking status: Never Smoker  . Smokeless tobacco: Never Used  Vaping Use  . Vaping Use: Never used  Substance and Sexual Activity  . Alcohol use: Not Currently    Comment: rarely  . Drug use: Never  . Sexual activity: Yes    Birth control/protection: Surgical    Comment: essure  Other Topics Concern  . Not on file  Social History Narrative  . Not on file   Social Determinants of Health   Financial Resource Strain: Not on file  Food Insecurity: Not on file  Transportation Needs: Not on file  Physical Activity: Not on file  Stress: Not on file  Social Connections: Not on file     Family History: The patient's family history includes Breast cancer in her mother; Hypertension in her father and mother; Ovarian cysts in her sister; Parkinson's disease in her father; Prostate cancer in her father.  ROS:   Please see the history of present illness.    All other systems reviewed and are negative.  EKGs/Labs/Other Studies Reviewed:    The following studies were reviewed today:  July 08, 2020 echo Left ventricular function normal, 60% Right ventricular function normal No significant valvular abnormalities  EKG:   The ekg ordered today demonstrates AV sequential pacing  Recent Labs: 07/28/2020: TSH 1.450 09/05/2020: ALT 19; BUN 28; Creatinine, Ser 0.71; Hemoglobin 12.5; Platelets 299; Potassium 3.9; Sodium 140  Recent Lipid Panel    Component Value Date/Time   CHOL 245 (H) 07/28/2020 1421   TRIG 155 (H) 07/28/2020 1421   HDL 73 07/28/2020 1421   CHOLHDL 3.4 07/28/2020 1421   LDLCALC 145 (H) 07/28/2020 1421    Physical Exam:    VS:  BP (!) 134/92   Pulse 70   Ht 5\' 1"  (1.549 m)   Wt 165 lb 3.2 oz (74.9 kg)   BMI 31.21 kg/m     Wt Readings from Last 3 Encounters:  01/07/21 165 lb 3.2 oz (74.9 kg)  12/09/20 164 lb 6.4 oz (74.6 kg)  11/11/20 164 lb (74.4 kg)  GEN:  Well nourished, well developed in no acute distress HEENT: Normal NECK: No JVD; No carotid bruits LYMPHATICS: No lymphadenopathy CARDIAC: RRR, no murmurs, rubs, gallops.  Pacemaker pocket well-healed in left chest.  No significant burden of surface collaterals visible on my exam. RESPIRATORY:  Clear to auscultation without rales, wheezing or rhonchi  ABDOMEN: Soft, non-tender, non-distended MUSCULOSKELETAL:  No edema; No deformity  SKIN: Warm and dry NEUROLOGIC:  Alert and oriented x 3 PSYCHIATRIC:  Normal affect   ASSESSMENT:    1. SVT (supraventricular tachycardia) (HCC)   2. Complete heart block (HCC)   3. Pacemaker lead malfunction, initial encounter   4. Sinus node dysfunction (HCC)    PLAN:    In order of problems listed above:  1. Pacemaker lead malfunction Patient with a dual-chamber permanent pacemaker initially implanted 22 years ago.  She has had several generator replacements since then, most recently 2015.  She has a non-MRI compatible device with [employer] leads and a Marine scientist.  She now has evidence of elevated ventricular lead capture thresholds and significant atrial lead noise.  Given the age of her leads, there poor function and battery longevity, I think now is the time to consider  system extraction and implant of a new, MRI compatible, system.  She came well prepared to the visit today with many questions about the extraction procedure.  I reviewed the pacemaker extraction procedure in detail during today's visit.  We discussed the tools used for extraction, the safety protocols in place in case of emergency during extraction.  We discussed the need for cardiothoracic surgical backup.  I would like for her to continue with the plan to get the cardiac PET study to assess for possible sarcoidosis.  We will also order a CT of the chest with contrast (extraction protocol).  This will help [place] further risk stratify her given the age of her leads.  We discussed how the age of her leads, her age, absence of defibrillator lead all play a role in assessing the risk of the extraction procedure.  We will plan to touch base in clinic after she has the CT scan and the cardiac PET to review the results and to finalize a timeline.  I would anticipate the extraction procedure occurring in the next few months.   Today we have made a few programming changes including setting an atrial sensitivity and disabling auto sensitivity on the atrial lead  We discussed the possibility that the cardiac PET scan would result in suggest the presence of cardiac sarcoidosis.  This would change the new system to a dual-chamber ICD system.  Total time spent with patient today 65 minutes. This includes reviewing records, evaluating the patient and coordinating care.   Medication Adjustments/Labs and Tests Ordered: Current medicines are reviewed at length with the patient today.  Concerns regarding medicines are outlined above.  Orders Placed This Encounter  Procedures  . CT LEAD EXTRACTION W/CONTRAST  . Basic Metabolic Panel (BMET)  . EKG 12-Lead   Meds ordered this encounter  Medications  . predniSONE (DELTASONE) 50 MG tablet    Sig: Take 1 tablet PO 13 hours, 7 hours and 1 hour prior to cardiac CT     Dispense:  3 tablet    Refill:  0     Signed, Steffanie Dunn, MD, [facility], [facility] 01/07/2021 9:26 PM    Electrophysiology [facility]

## 2021-01-08 LAB — BASIC METABOLIC PANEL
BUN/Creatinine Ratio: 19 (ref 9–23)
BUN: 17 mg/dL (ref 6–24)
CO2: 24 mmol/L (ref 20–29)
Calcium: 10.1 mg/dL (ref 8.7–10.2)
Chloride: 101 mmol/L (ref 96–106)
Creatinine, Ser: 0.88 mg/dL (ref 0.57–1.00)
Glucose: 94 mg/dL (ref 65–99)
Potassium: 4.5 mmol/L (ref 3.5–5.2)
Sodium: 141 mmol/L (ref 134–144)
eGFR: 82 mL/min/{1.73_m2} (ref >59)

## 2021-01-19 ENCOUNTER — Telehealth: Payer: Self-pay | Admitting: Internal Medicine

## 2021-01-19 NOTE — Telephone Encounter (Signed)
    Emma Long with [facility] she said pt is scheduled on 06/27 there for PET scan and they need prior authorization

## 2021-01-19 NOTE — Telephone Encounter (Signed)
Will route to pre cert team

## 2021-01-20 ENCOUNTER — Telehealth (HOSPITAL_COMMUNITY): Payer: Self-pay | Admitting: Emergency Medicine

## 2021-01-20 NOTE — Telephone Encounter (Signed)
Reaching out to patient to offer assistance regarding upcoming cardiac imaging study; pt verbalizes understanding of appt date/time, parking situation and where to check in, pre-test NPO status and medications ordered, and verified current allergies; name and call back number provided for further questions should they arise Rockwell Alexandria RN Navigator Cardiac Imaging [facility] [phone] office [phone] cell  Hold adderall  13 hr prep

## 2021-01-20 NOTE — Telephone Encounter (Signed)
I have been keeping up with this patient's PET scan. I was told by precert that they could not try for another authorization until after her current authorization expires on 01/22/21.  To precert- I was going to reach out to you on Thursday to please try to get another authorization ASAP for this patient's myocarial PET that is scheduled at [facility] on 02/02/21.  Will go ahead and forward this message now as well.   Thank you!

## 2021-01-20 NOTE — Telephone Encounter (Signed)
Attempted to call Ava back at [facility]- no answer- attempted to leave a message to please call me back.  I am unsure if the message went through with this particular answering system.   Will call back at a later time if no call back today.

## 2021-01-22 ENCOUNTER — Ambulatory Visit (HOSPITAL_COMMUNITY)
Admission: RE | Admit: 2021-01-22 | Discharge: 2021-01-22 | Disposition: A | Discharge status: Home / Self Care | Payer: 59 | Source: Ambulatory Visit | Attending: Cardiology | Admitting: Cardiology

## 2021-01-22 ENCOUNTER — Other Ambulatory Visit: Payer: Self-pay

## 2021-01-22 DIAGNOSIS — I442 Atrioventricular block, complete: Secondary | ICD-10-CM

## 2021-01-22 DIAGNOSIS — T82110A Breakdown (mechanical) of cardiac electrode, initial encounter: Secondary | ICD-10-CM | POA: Insufficient documentation

## 2021-01-22 MED ORDER — IOHEXOL 300 MG/ML  SOLN
100.0000 mL | Freq: Once | INTRAMUSCULAR | Status: AC | PRN
  Administered 2021-01-22: 100 mL via INTRAVENOUS

## 2021-01-22 NOTE — Progress Notes (Signed)
Patient CT imaging completed. PIV removed. No signs or symptoms of allergic reaction to contrast. Discharged to lobby in stable condition after scan completed.

## 2021-01-23 NOTE — Telephone Encounter (Signed)
To precert to please attempt to reauthorize the patient's myocardial PET sarcoid study that is scheduled for 02/02/21 at [facility]. He previous authorization ran out yesterday.

## 2021-01-23 NOTE — Telephone Encounter (Signed)
Per a staff message received from precert today:  Abdul-Razzaaq, Cline Cools, RN Good Morning,   The new case# [id number]  is in clinical review. I sent clinicals. It will goquickly because it was already approved.

## 2021-01-26 ENCOUNTER — Other Ambulatory Visit: Payer: Self-pay

## 2021-01-26 MED ORDER — METOPROLOL SUCCINATE ER 25 MG PO TB24: 25.0000 mg | ORAL_TABLET | Freq: Every day | ORAL | 5 refills | Status: DC

## 2021-01-27 ENCOUNTER — Other Ambulatory Visit: Payer: Self-pay

## 2021-01-27 MED ORDER — METOPROLOL SUCCINATE ER 25 MG PO TB24: 25.0000 mg | ORAL_TABLET | Freq: Every day | ORAL | 1 refills | Status: DC

## 2021-01-27 NOTE — Telephone Encounter (Signed)
Staff message received from Precert staff today: Abdul-Razzaaq, Cline Cools, RN Good Afternoon,   I have an approved auth for [id number].  Auth# [id number] Exp - 03/13/21        I called the [facility] to advise them of this updated authorization # for the patient's myocardial PET sarcoid study that is scheduled for 02/02/21.  I was transferred to [employer] voice mail [facility] Coordinator) and left a detailed message of the above information.  I asked that she call me back to confirm that she received my message with the updated authorization #.

## 2021-01-27 NOTE — Telephone Encounter (Signed)
Call received back from [facility]) at [facility].  She confirmed she received my message- authorization/ expiration #'s read back for verification.   Per Dondra Spry, she will update the patient's appointment notes and nothing else is needed from [place] at this time.

## 2021-02-05 ENCOUNTER — Other Ambulatory Visit: Payer: Self-pay

## 2021-02-17 NOTE — Progress Notes (Signed)
Electrophysiology Office Follow up Visit Note:    Date:  02/18/2021   ID:  Emma Long, DOB 1975/09/11, MRN [id number]  PCP:  Patient, No Pcp Per (Inactive)  [facility] Cardiologist:  Kate Sable, MD  [facility] Electrophysiologist:  Vickie Epley, MD    Interval History:    Emma Long is a 45 y.o. female who presents for a follow up visit. They were last seen in clinic 01/07/2021 for a pacemaker lead malfunction. Her pacemaker was implanted 22 years ago.  Since I saw her she had her cardiac CT and cardiac PET.  Today she is with her husband in clinic who had not previously met.     Past Medical History:  Diagnosis Date   ADHD    Arrhythmia    Atrial flutter ([place])    Chicken pox    Heart disease 06/30/2020   Pacemaker 02/1999   Pain in left knee 03/06/2018   Sinoatrial node dysfunction [facility])     Past Surgical History:  Procedure Laterality Date   APPLICATION OF WOUND VAC Left 09/22/2020   Procedure: APPLICATION OF WOUND VAC;  Surgeon: Dereck Leep, MD;  Location: [facility] ORS;  Service: Orthopedics;  Laterality: Left;   ARTHROSCOPIC REPAIR ACL Left 1996   BILATERAL CARPAL TUNNEL RELEASE Bilateral 2007   CARDIAC ELECTROPHYSIOLOGY STUDY AND ABLATION  1996-1999   x4   HYSTEROSCOPY  2004   essure placement   INSERT / REPLACE / REMOVE PACEMAKER  02/1999   SA node blocked from scar tissue formed after 4th ablation   KNEE ARTHROPLASTY Left 09/22/2020   Procedure: COMPUTER ASSISTED TOTAL KNEE ARTHROPLASTY;  Surgeon: Dereck Leep, MD;  Location: [facility] ORS;  Service: Orthopedics;  Laterality: Left;   LIPOMA EXCISION  2001   left scapula   TONSILLECTOMY AND ADENOIDECTOMY  1992    Current Medications: Current Meds  Medication Sig   amphetamine-dextroamphetamine (ADDERALL) 20 MG tablet Take 20 mg by mouth 2 (two) times daily as needed (attention/focus).   ascorbic acid (VITAMIN C) 1000 MG tablet Take 1,000 mg by mouth daily.   cholecalciferol (VITAMIN  D) 25 MCG (1000 UNIT) tablet Take 3,000-5,000 Units by mouth daily.   diclofenac Sodium (VOLTAREN) 1 % GEL Apply 1 application topically daily. Knee pain.   flecainide (TAMBOCOR) 50 MG tablet Take 50 mg by mouth daily.   lidocaine-prilocaine (EMLA) cream Apply 1 application topically 2 (two) times daily as needed (plantar fasciitis pain.).   metoprolol succinate (TOPROL-XL) 25 MG 24 hr tablet Take 1 tablet (25 mg total) by mouth daily.   VYVANSE 70 MG capsule Take 70 mg by mouth daily.   Zinc 50 MG TABS Take 50 mg by mouth daily.     Allergies:   Iodinated diagnostic agents, Other, Tape, and Wound dressing adhesive   Social History   Socioeconomic History   Marital status: Married    Spouse name: Emma Long   Number of children: 2   Years of education: Not on file   Highest education level: Not on file  Occupational History   Not on file  Tobacco Use   Smoking status: Never   Smokeless tobacco: Never  Vaping Use   Vaping Use: Never used  Substance and Sexual Activity   Alcohol use: Not Currently    Comment: rarely   Drug use: Never   Sexual activity: Yes    Birth control/protection: Surgical    Comment: essure  Other Topics Concern   Not on file  Social  History Narrative   Not on file   Social Determinants of Health   Financial Resource Strain: Not on file  Food Insecurity: Not on file  Transportation Needs: Not on file  Physical Activity: Not on file  Stress: Not on file  Social Connections: Not on file     Family History: The patient's family history includes Breast cancer in her mother; Hypertension in her father and mother; Ovarian cysts in her sister; Parkinson's disease in her father; Prostate cancer in her father.  ROS:   Please see the history of present illness.    All other systems reviewed and are negative.  EKGs/Labs/Other Studies Reviewed:    The following studies were reviewed today:  01/22/2021 CT Lead extraction protocol IMPRESSION: 1. The RA  lead touches the wall of the SVC >1 cm laterally. There is calcified material which could represent calcified thrombus given the age of the lead.   2. The RV lead courses centrally in the SVC, but does touch the SVC > 1 cm at the junction of the SVC/RA. There is calcified material attached to the lead in this region which could represent calcified thrombus given the age of the lead.   3. The RA/RV leads terminate in the RA wall and RV apex without high-risk features.   4. No lead fractures detected.   5. Overall, these findings are moderate risk for CIED lead extraction based on the course of the leads in the SVC and SVC/RA junction. Also, note the calcified thrombus in the SVC/RA region.     June 27th 2022 cardiac PET Cardiac PET metabolic study showed multiple segments with increased metabolic activity particularly in the lateral wall consistent with active inflammatory process such as sarcoidosis or myocardial inflammation from other etiology.  Total area of inflammation is about 37%.  No evidence for abnormal metabolism in the RV free wall. Small area of scar/fibrosis in the left ventricle totaling about 5% of the left ventricle suggestive of previous inflammatory process that is now resolved.  No RVH.  No definite evidence of extracardiac areas of hypermetabolism.    February 18, 2021 in clinic device interrogation Battery longevity 1.2 years, voltage 2.81 V Lead parameters show ventricular pacing threshold 1.0 at 1.0 ms Atrial lead capture threshold 0.5 at 0.6 Sensing in the atrium 0.5 Lead impedance stable Ventricular lead programmed at 3.25 at 1 Greater than 99% ventricular pacing 98% atrially pacing 3 mode switch episodes correspond to lead noise most recently on June 25    EKG:  The ekg ordered today demonstrates ventricular pacing.  Recent Labs: 07/28/2020: TSH 1.450 09/05/2020: ALT 19; Hemoglobin 12.5; Platelets 299 01/07/2021: BUN 17; Creatinine, Ser 0.88; Potassium  4.5; Sodium 141  Recent Lipid Panel    Component Value Date/Time   CHOL 245 (H) 07/28/2020 1421   TRIG 155 (H) 07/28/2020 1421   HDL 73 07/28/2020 1421   CHOLHDL 3.4 07/28/2020 1421   LDLCALC 145 (H) 07/28/2020 1421    Physical Exam:    VS:  BP 118/78 (BP Location: Left Arm, Patient Position: Sitting, Cuff Size: Normal)   Pulse 72   Ht '5\' 1"'  (1.549 m)   Wt 167 lb (75.8 kg)   SpO2 98%   BMI 31.55 kg/m     Wt Readings from Last 3 Encounters:  02/18/21 167 lb (75.8 kg)  01/07/21 165 lb 3.2 oz (74.9 kg)  12/09/20 164 lb 6.4 oz (74.6 kg)     GEN:  Well nourished, well developed in no acute  distress HEENT: Normal NECK: No JVD; No carotid bruits LYMPHATICS: No lymphadenopathy CARDIAC: RRR, no murmurs, rubs, gallops RESPIRATORY:  Clear to auscultation without rales, wheezing or rhonchi  ABDOMEN: Soft, non-tender, non-distended MUSCULOSKELETAL:  No edema; No deformity  SKIN: Warm and dry NEUROLOGIC:  Alert and oriented x 3 PSYCHIATRIC:  Normal affect   ASSESSMENT:    1. Pacemaker lead malfunction, initial encounter   2. SVT (supraventricular tachycardia) ([place])   3. 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 Pacemaker lead malfunction, initial encounter Patient with atrial lead noise and elevated ventricular capture threshold.  Pacemaker with just over 1 year battery longevity remaining.  Given the proximity to pacemaker ERI, in setting of lead malfunction, would favor complete system extraction.  In light of myocardial inflammation patient will need upgrade to ICD system.  Planning to refer to [facility] for evaluation of the inflammatory cardiomyopathy.  Would favor the system extraction and reimplantation occurring in [place] given treatment of the inflammatory cardiomyopathy will likely require steroids and this will impact the risk profile of the extraction procedure.    Today I  had an extensive discussion with the patient and her husband about system extraction and reimplantation of a defibrillator.  We discussed how the extraction procedure will need to be timed with the diagnostic evaluation of her inflammatory cardiomyopathy and treatment with probable steroids.  2. SVT (supraventricular tachycardia) ([place]) 3. Complete heart block ([facility])  Likely because of inflammatory cardiomyopathy and associated scarring.  Pacemaker with just over 1 year battery longevity remaining.   4. Inflammatory cardiomyopathy [facility]) Discussions ongoing between her primary electrophysiologist, Dr. Caryl Comes, and [facility] EP, Dr. Burt Knack.  Patient to be referred to [facility] for further diagnostic evaluation and treatment.         Total time spent with patient today 50 minutes. This includes reviewing records, evaluating the patient and coordinating care.   Medication Adjustments/Labs and Tests Ordered: Current medicines are reviewed at length with the patient today.  Concerns regarding medicines are outlined above.  Orders Placed This Encounter  Procedures   EKG 12-Lead    No orders of the defined types were placed in this encounter.    Signed, Lars Mage, MD, [facility], [facility] 02/18/2021 11:12 AM    Electrophysiology [facility]

## 2021-02-18 ENCOUNTER — Other Ambulatory Visit: Payer: Self-pay

## 2021-02-18 ENCOUNTER — Encounter: Payer: Self-pay | Admitting: Cardiology

## 2021-02-18 ENCOUNTER — Ambulatory Visit (INDEPENDENT_AMBULATORY_CARE_PROVIDER_SITE_OTHER): Payer: 59 | Admitting: Cardiology

## 2021-02-18 VITALS — BP 118/78 | HR 72 | Ht 61.0 in | Wt 167.0 lb

## 2021-02-18 DIAGNOSIS — T82110D Breakdown (mechanical) of cardiac electrode, subsequent encounter: Secondary | ICD-10-CM

## 2021-02-18 DIAGNOSIS — I471 Supraventricular tachycardia: Secondary | ICD-10-CM

## 2021-02-18 DIAGNOSIS — I428 Other cardiomyopathies: Secondary | ICD-10-CM | POA: Diagnosis not present

## 2021-02-18 DIAGNOSIS — I442 Atrioventricular block, complete: Secondary | ICD-10-CM | POA: Diagnosis not present

## 2021-02-18 DIAGNOSIS — T82110A Breakdown (mechanical) of cardiac electrode, initial encounter: Secondary | ICD-10-CM | POA: Diagnosis not present

## 2021-02-18 LAB — PACEMAKER DEVICE OBSERVATION

## 2021-02-18 NOTE — Patient Instructions (Addendum)
Medication Instructions:  Your physician recommends that you continue on your current medications as directed. Please refer to the Current Medication list given to you today. *If you need a refill on your cardiac medications before your next appointment, please call your pharmacy*  Lab Work: None ordered. If you have labs (blood work) drawn today and your tests are completely normal, you will receive your results only by: MyChart Message (if you have MyChart) OR A paper copy in the mail If you have any lab test that is abnormal or we need to change your treatment, we will call you to review the results.  Testing/Procedures: None ordered.  Follow-Up: At [facility], you and your health needs are our priority.  As part of our continuing mission to provide you with exceptional heart care, we have created designated Provider Care Teams.  These Care Teams include your primary Cardiologist (physician) and Advanced Practice Providers (APPs -  Physician Assistants and Nurse Practitioners) who all work together to provide you with the care you need, when you need it.  Your next appointment:   Your physician wants you to follow-up in: as needed with Dr. Lalla Brothers.     Remote monitoring is used to monitor your Pacemaker from home. This monitoring reduces the number of office visits required to check your device to one time per year. It allows [place] to keep an eye on the functioning of your device to ensure it is working properly. You are scheduled for a device check from home on 03/04/2021. You may send your transmission at any time that day. If you have a wireless device, the transmission will be sent automatically. After your physician reviews your transmission, you will receive a postcard with your next transmission date.

## 2021-02-19 ENCOUNTER — Telehealth: Payer: Self-pay | Admitting: Internal Medicine

## 2021-02-19 NOTE — Telephone Encounter (Signed)
Patient is calling to discuss follow up from last visit. Please assist.

## 2021-02-19 NOTE — Telephone Encounter (Signed)
Clarified with our scheduling team that the patient was calling in regards to a referral to the [facility].  I have reached out to Dr. Graciela Husbands by phone and he is supposed to contact her and discuss the Myocardial PET and implications for the referral.   Waiting on MD to reach back out to me with how to proceed.

## 2021-02-20 NOTE — Telephone Encounter (Signed)
Dr. Graciela Husbands Lambert's office notes/ myocardial PET sarcoid study/ echo/ EKG's/ lead extraction CT/ demographics faxed to:  [facility]- Dr. Beverlyn Roux Attn: Al Pimple Fax: [phone]  Confirmation received.    I have called and notified the patient that her referral/ records have been faxed to the [facility]. I have advised if she does not hear back from their office in the next 2 weeks to please call me back and let me know so I may follow up. I advised her I was unaware of the turn around time on these appointments.  The patient voices understanding and is agreeable.

## 2021-02-20 NOTE — Telephone Encounter (Signed)
Emma Long   Can we Fax a demographic sheet to   [phone]  Include please records, of PET scan and my note and CL notes  Thanks SK

## 2021-02-20 NOTE — Telephone Encounter (Signed)
Message received from Dr. Graciela Husbands stating he spoke with the patient. The message also stated he had emailed the referral contact and  Dr. Beverlyn Roux asking for next steps. If he didn't hear back from them by today he would reach back out to them.

## 2021-03-04 ENCOUNTER — Telehealth: Payer: Self-pay

## 2021-03-04 ENCOUNTER — Ambulatory Visit (INDEPENDENT_AMBULATORY_CARE_PROVIDER_SITE_OTHER): Payer: 59

## 2021-03-04 DIAGNOSIS — I442 Atrioventricular block, complete: Secondary | ICD-10-CM

## 2021-03-04 LAB — CUP PACEART REMOTE DEVICE CHECK
Battery Remaining Longevity: 1 mo
Battery Remaining Percentage: 2 %
Battery Voltage: 2.81 V
Brady Statistic AP VP Percent: 99 %
Brady Statistic AP VS Percent: 1 %
Brady Statistic AS VP Percent: 1.4 %
Brady Statistic AS VS Percent: 1 %
Brady Statistic RA Percent Paced: 98 %
Brady Statistic RV Percent Paced: 99 %
Date Time Interrogation Session: 20220727020012
Implantable Lead Implant Date: 20150114
Implantable Lead Implant Date: 20150114
Implantable Lead Location: 753859
Implantable Lead Location: 753860
Implantable Lead Model: 5068
Implantable Pulse Generator Implant Date: 20150114
Lead Channel Impedance Value: 380 Ohm
Lead Channel Impedance Value: 490 Ohm
Lead Channel Pacing Threshold Amplitude: 0.5 V
Lead Channel Pacing Threshold Amplitude: 1 V
Lead Channel Pacing Threshold Pulse Width: 0.6 ms
Lead Channel Pacing Threshold Pulse Width: 1 ms
Lead Channel Sensing Intrinsic Amplitude: 0.7 mV
Lead Channel Sensing Intrinsic Amplitude: 6.7 mV
Lead Channel Setting Pacing Amplitude: 1.5 V
Lead Channel Setting Pacing Amplitude: 3.25 V
Lead Channel Setting Pacing Pulse Width: 1 ms
Lead Channel Setting Sensing Sensitivity: 4 mV
Pulse Gen Model: 2240
Pulse Gen Serial Number: 7576209

## 2021-03-04 NOTE — Telephone Encounter (Signed)
Scheduled remote reviewed. Normal device function.   Battery <86mo, 0-3 months.  2.81V (RRT 2.60V) Next remote scheduled in Oct, route to triage for increased frequency.  Previous estimates for battery were >1year.  Battery voltage the same at 2.81V.  ERI @2 .6V.    Spoke with pt, she reports history of rapid depletion in battery.  She states she is scheduled [facility] at end of August.  Advised I would forward to Dr. 03-11-2001 to ensure he is aware.    Scheduled patient for follow-up transmission next week and then monthly therafter.

## 2021-03-05 NOTE — Telephone Encounter (Signed)
Pt called to discuss battery concerns.  Reviewed data with her-  Battery voltage in May was 2.86v, on 7/13 it was 2.81V, yesterday it was 2.81V.  ERI is at 2.6V.  WE are watching this closely yo ensure that she doesn't run out of battery too quickly.  Next scheduled check is on 03/11/21.

## 2021-03-11 ENCOUNTER — Ambulatory Visit (INDEPENDENT_AMBULATORY_CARE_PROVIDER_SITE_OTHER): Payer: 59

## 2021-03-11 DIAGNOSIS — I442 Atrioventricular block, complete: Secondary | ICD-10-CM

## 2021-03-11 LAB — CUP PACEART REMOTE DEVICE CHECK
Battery Remaining Longevity: 1 mo
Battery Remaining Percentage: 2 %
Battery Voltage: 2.81 V
Brady Statistic AP VP Percent: 99 %
Brady Statistic AP VS Percent: 1 %
Brady Statistic AS VP Percent: 1.4 %
Brady Statistic AS VS Percent: 1 %
Brady Statistic RA Percent Paced: 99 %
Brady Statistic RV Percent Paced: 99 %
Date Time Interrogation Session: 20220803020021
Implantable Lead Implant Date: 20150114
Implantable Lead Implant Date: 20150114
Implantable Lead Location: 753859
Implantable Lead Location: 753860
Implantable Lead Model: 5068
Implantable Pulse Generator Implant Date: 20150114
Lead Channel Impedance Value: 430 Ohm
Lead Channel Impedance Value: 540 Ohm
Lead Channel Pacing Threshold Amplitude: 0.5 V
Lead Channel Pacing Threshold Amplitude: 1 V
Lead Channel Pacing Threshold Pulse Width: 0.6 ms
Lead Channel Pacing Threshold Pulse Width: 1 ms
Lead Channel Sensing Intrinsic Amplitude: 0.5 mV
Lead Channel Sensing Intrinsic Amplitude: 6.7 mV
Lead Channel Setting Pacing Amplitude: 1.5 V
Lead Channel Setting Pacing Amplitude: 3.25 V
Lead Channel Setting Pacing Pulse Width: 1 ms
Lead Channel Setting Sensing Sensitivity: 4 mV
Pulse Gen Model: 2240
Pulse Gen Serial Number: 7576209

## 2021-03-27 NOTE — Progress Notes (Signed)
Remote pacemaker transmission.   

## 2021-03-30 ENCOUNTER — Telehealth: Payer: Self-pay | Admitting: Cardiology

## 2021-03-30 NOTE — Telephone Encounter (Signed)
Spoke with patient.  She is not at home right now to send a new transmission.  She requested what battery life was when she sent one last week./  Advised her voltage is at 2.8V, which the device estimates is 3.8 months until ERI.

## 2021-03-30 NOTE — Telephone Encounter (Signed)
  1. Has your device fired? No ppm only   2. Is you device beeping? no  3. Are you experiencing draining or swelling at device site? no  4. Are you calling to see if we received your device transmission? Wants to submit   5. Have you passed out? No told battery was low and wants to check status something doesn't seem right feels like HR is higher then it should be normally     Please route to Device Clinic Pool

## 2021-04-01 ENCOUNTER — Telehealth: Payer: Self-pay | Admitting: Cardiology

## 2021-04-01 NOTE — Telephone Encounter (Signed)
Received forms for [employer]IENT [employer] FMLA .  Patient aware and will come to office to complete ROI and payment.   Prepped and in folder at [employer] .

## 2021-04-07 NOTE — Progress Notes (Signed)
Remote pacemaker transmission.   

## 2021-04-14 ENCOUNTER — Ambulatory Visit (INDEPENDENT_AMBULATORY_CARE_PROVIDER_SITE_OTHER): Payer: 59

## 2021-04-14 DIAGNOSIS — I442 Atrioventricular block, complete: Secondary | ICD-10-CM

## 2021-04-14 LAB — CUP PACEART REMOTE DEVICE CHECK
Battery Remaining Longevity: 1 mo
Battery Remaining Percentage: 0.5 %
Battery Voltage: 2.78 V
Brady Statistic AP VP Percent: 99 %
Brady Statistic AP VS Percent: 1 %
Brady Statistic AS VP Percent: 1 %
Brady Statistic AS VS Percent: 1 %
Brady Statistic RA Percent Paced: 99 %
Brady Statistic RV Percent Paced: 99 %
Date Time Interrogation Session: 20220906040015
Implantable Lead Implant Date: 20150114
Implantable Lead Implant Date: 20150114
Implantable Lead Location: 753859
Implantable Lead Location: 753860
Implantable Lead Model: 5068
Implantable Pulse Generator Implant Date: 20150114
Lead Channel Impedance Value: 400 Ohm
Lead Channel Impedance Value: 510 Ohm
Lead Channel Pacing Threshold Amplitude: 0.75 V
Lead Channel Pacing Threshold Amplitude: 1 V
Lead Channel Pacing Threshold Pulse Width: 0.6 ms
Lead Channel Pacing Threshold Pulse Width: 1 ms
Lead Channel Sensing Intrinsic Amplitude: 0.6 mV
Lead Channel Sensing Intrinsic Amplitude: 6.7 mV
Lead Channel Setting Pacing Amplitude: 1.5 V
Lead Channel Setting Pacing Amplitude: 3.25 V
Lead Channel Setting Pacing Pulse Width: 1 ms
Lead Channel Setting Sensing Sensitivity: 4 mV
Pulse Gen Model: 2240
Pulse Gen Serial Number: 7576209

## 2021-04-22 NOTE — Progress Notes (Signed)
Remote pacemaker transmission.   

## 2021-05-12 NOTE — Telephone Encounter (Signed)
Patient declined at this time .  Will fu with new forms request if needed in the future.

## 2021-05-15 ENCOUNTER — Telehealth: Payer: Self-pay | Admitting: Internal Medicine

## 2021-05-15 NOTE — Telephone Encounter (Signed)
Returned patient call. Patient wanting to know battery voltage status on today's transmission. Provided patient with information. This is patient's second device and states she rapidly depleted at this voltage with her last device and she is to call implanting provider (outside of [facility]) when battery is at 1 month til ERI. Current voltage is 2.75V with longevity estimated at 2.8 months. Patient appreciative of information. She is encouraged to send manual transmission as she feels necessary in order to have reassurance.

## 2021-05-15 NOTE — Telephone Encounter (Signed)
The patient called back and wants the nurse to give her a called back.

## 2021-05-15 NOTE — Telephone Encounter (Signed)
  1. Has your device fired? no  2. Is you device beeping? no  3. Are you experiencing draining or swelling at device site? no  4. Are you calling to see if we received your device transmission? Yes  Patient told battery getting low.  Wants to know what this transmission results are so [facility]  Patient last transmission 9/6 and she states dates scheduled seem off in [place] .    5. Have you passed out? no    Please route to Device Clinic Pool

## 2021-05-15 NOTE — Telephone Encounter (Signed)
I left a message that the transmission are monthly because she has low battery. I left device clinic number so she can speak with nurse about transmission results.

## 2021-06-01 ENCOUNTER — Telehealth: Payer: Self-pay

## 2021-06-01 NOTE — Telephone Encounter (Signed)
The patient wanted to know the how much battery life she has on her battery. She has an appointment with the [facility] clinic tomorrow. I told her she had 3 months to ERI and 2.72  Voltage left. The patient thanked me for my help and verbalized understanding.

## 2021-06-02 LAB — CUP PACEART REMOTE DEVICE CHECK
Battery Remaining Longevity: 1 mo
Battery Remaining Percentage: 4 %
Battery Voltage: 2.72 V
Brady Statistic AP VP Percent: 99 %
Brady Statistic AP VS Percent: 1 %
Brady Statistic AS VP Percent: 1.2 %
Brady Statistic AS VS Percent: 1 %
Brady Statistic RA Percent Paced: 99 %
Brady Statistic RV Percent Paced: 99 %
Date Time Interrogation Session: 20221024061405
Implantable Lead Implant Date: 20150114
Implantable Lead Implant Date: 20150114
Implantable Lead Location: 753859
Implantable Lead Location: 753860
Implantable Lead Model: 5068
Implantable Pulse Generator Implant Date: 20150114
Lead Channel Impedance Value: 430 Ohm
Lead Channel Impedance Value: 510 Ohm
Lead Channel Pacing Threshold Amplitude: 0.75 V
Lead Channel Pacing Threshold Amplitude: 1 V
Lead Channel Pacing Threshold Pulse Width: 0.6 ms
Lead Channel Pacing Threshold Pulse Width: 1 ms
Lead Channel Sensing Intrinsic Amplitude: 0.7 mV
Lead Channel Sensing Intrinsic Amplitude: 6.7 mV
Lead Channel Setting Pacing Amplitude: 1.5 V
Lead Channel Setting Pacing Amplitude: 3.25 V
Lead Channel Setting Pacing Pulse Width: 1 ms
Lead Channel Setting Sensing Sensitivity: 4 mV
Pulse Gen Model: 2240
Pulse Gen Serial Number: 7576209

## 2021-06-03 ENCOUNTER — Ambulatory Visit (INDEPENDENT_AMBULATORY_CARE_PROVIDER_SITE_OTHER): Payer: 59

## 2021-06-03 DIAGNOSIS — I442 Atrioventricular block, complete: Secondary | ICD-10-CM | POA: Diagnosis not present

## 2021-06-08 NOTE — Progress Notes (Signed)
Remote pacemaker transmission.   

## 2021-06-08 NOTE — Telephone Encounter (Signed)
Called and confirmed with device clinic, they do see scheduled transmission from 10/24 but not from today.  Called and spoke with Emma Long.  Notified per device clinic, 3 months of battery until ERI. And shows normal device function.  Pt states that from her history, d/t incr rate of consumption she uses up twice the rate of battery so she would need replaced prior to 3 months.  Notified transmission not seen from today. Pt is going to transmit again and I will make device clinic aware.  Pt asks for call from device clinic after transmission.  Will also make Dr. Odessa Fleming nurse aware of pt's concern for ERI.

## 2021-06-09 NOTE — Telephone Encounter (Signed)
Transmission received 06/08/2021 at 9:14 pm. Pt needs nurse to call her back.

## 2021-06-09 NOTE — Telephone Encounter (Signed)
Successful telephone encounter to patient to discuss remaining battery life (specifically voltage) and to see if any arhythmia were noted as patient states she has felt palpitations over the last couple of days. Provided patient with information below. 4 brief AMS episodes noted 10/31 appear to be related to known atrial lead noise and PACs. Patient aware. Thankful for call and information. Will continue to follow monthly for battery voltage.

## 2021-06-15 ENCOUNTER — Ambulatory Visit (INDEPENDENT_AMBULATORY_CARE_PROVIDER_SITE_OTHER): Payer: 59

## 2021-06-15 DIAGNOSIS — I442 Atrioventricular block, complete: Secondary | ICD-10-CM

## 2021-06-15 LAB — CUP PACEART REMOTE DEVICE CHECK
Battery Remaining Longevity: 1 mo
Battery Remaining Percentage: 3 %
Battery Voltage: 2.71 V
Brady Statistic AP VP Percent: 98 %
Brady Statistic AP VS Percent: 1 %
Brady Statistic AS VP Percent: 2 %
Brady Statistic AS VS Percent: 1 %
Brady Statistic RA Percent Paced: 98 %
Brady Statistic RV Percent Paced: 99 %
Date Time Interrogation Session: 20221107010021
Implantable Lead Implant Date: 20150114
Implantable Lead Implant Date: 20150114
Implantable Lead Location: 753859
Implantable Lead Location: 753860
Implantable Lead Model: 5068
Implantable Pulse Generator Implant Date: 20150114
Lead Channel Impedance Value: 430 Ohm
Lead Channel Impedance Value: 490 Ohm
Lead Channel Pacing Threshold Amplitude: 0.75 V
Lead Channel Pacing Threshold Amplitude: 1 V
Lead Channel Pacing Threshold Pulse Width: 0.6 ms
Lead Channel Pacing Threshold Pulse Width: 1 ms
Lead Channel Sensing Intrinsic Amplitude: 0.7 mV
Lead Channel Sensing Intrinsic Amplitude: 6.7 mV
Lead Channel Setting Pacing Amplitude: 1.5 V
Lead Channel Setting Pacing Amplitude: 3.25 V
Lead Channel Setting Pacing Pulse Width: 1 ms
Lead Channel Setting Sensing Sensitivity: 4 mV
Pulse Gen Model: 2240
Pulse Gen Serial Number: 7576209

## 2021-06-18 ENCOUNTER — Encounter: Payer: Self-pay | Admitting: Radiology

## 2021-06-18 NOTE — Addendum Note (Signed)
Addended by: Elease Etienne A on: 06/18/2021 02:13 PM   Modules accepted: Level of Service

## 2021-06-18 NOTE — Progress Notes (Signed)
Remote pacemaker transmission.   

## 2021-06-30 ENCOUNTER — Encounter: Payer: Self-pay | Admitting: Internal Medicine

## 2021-07-01 NOTE — Telephone Encounter (Signed)
The pt called to follow up about her my chart message. She states that the [facility] clinic is not going to do the change out. [facility] told her it will be too dangerous to extract the lead. What they was going to do is add an Atrial lead to her device. I told her the nurse sent a note to Dr. Lalla Brothers and Graciela Husbands to discuss who will do the changeout. She states she would love to stay in the loop about what is going on and to let them know what she discussed with the [facility] clinic. She also have concerns about her battery depleting faster than normal. I suggest that she continue to send weekly reports so we can keep a close eye on her battery. I told her I will relay the message and someone will call her back.

## 2021-07-06 ENCOUNTER — Encounter: Payer: Self-pay | Admitting: Internal Medicine

## 2021-07-09 ENCOUNTER — Ambulatory Visit (INDEPENDENT_AMBULATORY_CARE_PROVIDER_SITE_OTHER): Payer: 59

## 2021-07-09 ENCOUNTER — Encounter: Payer: Self-pay | Admitting: Internal Medicine

## 2021-07-09 ENCOUNTER — Other Ambulatory Visit: Payer: Self-pay

## 2021-07-09 ENCOUNTER — Ambulatory Visit (INDEPENDENT_AMBULATORY_CARE_PROVIDER_SITE_OTHER): Payer: 59 | Admitting: Internal Medicine

## 2021-07-09 VITALS — BP 138/79 | HR 68 | Ht 61.0 in | Wt 167.0 lb

## 2021-07-09 DIAGNOSIS — I498 Other specified cardiac arrhythmias: Secondary | ICD-10-CM | POA: Diagnosis not present

## 2021-07-09 DIAGNOSIS — I442 Atrioventricular block, complete: Secondary | ICD-10-CM

## 2021-07-09 DIAGNOSIS — R001 Bradycardia, unspecified: Secondary | ICD-10-CM

## 2021-07-09 DIAGNOSIS — Z950 Presence of cardiac pacemaker: Secondary | ICD-10-CM | POA: Diagnosis not present

## 2021-07-09 NOTE — Addendum Note (Signed)
Addended by: Sherri Rad C on: 07/09/2021 05:01 PM   Modules accepted: Orders

## 2021-07-09 NOTE — Progress Notes (Signed)
Electrophysiology TeleHealth Note   Due to national recommendations of social distancing due to COVID 19, an audio/video telehealth visit is felt to be most appropriate for this patient at this time.  See MyChart message from today for the patient's consent to telehealth for [facility].   Date:  07/09/2021   ID:  Emma Long, DOB 1976/01/26, MRN [id number]  Location: patient's home  Provider location: [address] [place]  Evaluation Performed: Follow-up visit  PCP:  Patient, No Pcp Per (Inactive)  Cardiologist:     Electrophysiologist:  SK   Chief Complaint:  device approaching ERI, abnormal PET  History of Present Illness:    Emma Long is a 45 y.o. female who presents via audio/video conferencing for a telehealth visit today.  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  Met w Dr Burt Knack 8/22; felt not to have sarcoid, but patient self diagnosis? Myocarditis>> Rx-self--?long COVID>> ivermectin  x 21 days. W resolution of brain fog, palpitations.    Pt suggested repeat PET scan to female; they felt not to be sarcoid Decision re--device upgrade to ICD thought not necessary.   Concern re ERI  Also bradycardia >> 40s  The patient denies symptoms of fevers, chills, cough, or new SOB worrisome for COVID 19.    Past Medical History:  Diagnosis Date   ADHD    Arrhythmia    Atrial flutter ([place])    Chicken pox    Heart disease 06/30/2020   Pacemaker 02/1999   Pain in left knee 03/06/2018   Sinoatrial node dysfunction [facility])     Past Surgical History:  Procedure Laterality Date   APPLICATION OF WOUND VAC Left 09/22/2020   Procedure: APPLICATION OF WOUND VAC;  Surgeon: Dereck Leep, MD;  Location: [facility] ORS;  Service: Orthopedics;  Laterality: Left;    ARTHROSCOPIC REPAIR ACL Left 1996   BILATERAL CARPAL TUNNEL RELEASE Bilateral 2007   CARDIAC ELECTROPHYSIOLOGY STUDY AND ABLATION  1996-1999   x4   HYSTEROSCOPY  2004   essure placement   INSERT / REPLACE / REMOVE PACEMAKER  02/1999   SA node blocked from scar tissue formed after 4th ablation   KNEE ARTHROPLASTY Left 09/22/2020   Procedure: COMPUTER ASSISTED TOTAL KNEE ARTHROPLASTY;  Surgeon: Dereck Leep, MD;  Location: [facility] ORS;  Service: Orthopedics;  Laterality: Left;   LIPOMA EXCISION  2001   left scapula   TONSILLECTOMY AND ADENOIDECTOMY  1992    Current Outpatient Medications  Medication Sig Dispense Refill   amphetamine-dextroamphetamine (ADDERALL) 20 MG tablet Take 20 mg by mouth 2 (two) times daily as needed (attention/focus).     flecainide (TAMBOCOR) 50 MG tablet Take 50 mg by mouth daily.     lidocaine-prilocaine (EMLA) cream Apply 1 application topically 2 (two) times daily as needed (plantar fasciitis pain.).     metoprolol succinate (TOPROL-XL) 25 MG 24 hr tablet Take 1 tablet (25 mg total) by mouth daily. 90 tablet 1   VYVANSE 70 MG capsule Take 70 mg by mouth daily.     ascorbic acid (VITAMIN C) 1000 MG tablet Take 1,000 mg by mouth daily. (Patient not taking: Reported on 07/09/2021)     cholecalciferol (VITAMIN D) 25 MCG (1000 UNIT) tablet Take 3,000-5,000 Units by mouth  daily. (Patient not taking: Reported on 07/09/2021)     diclofenac Sodium (VOLTAREN) 1 % GEL Apply 1 application topically daily. Knee pain. (Patient not taking: Reported on 07/09/2021)     Zinc 50 MG TABS Take 50 mg by mouth daily. (Patient not taking: Reported on 07/09/2021)     No current facility-administered medications for this visit.    Allergies:   Iodinated diagnostic agents, Other, Tape, and Wound dressing adhesive   Social History:  The patient  reports that she has never smoked. She has never used smokeless tobacco. She reports that she does not currently use alcohol. She reports that she  does not use drugs.   Family History:  The patient's   family history includes Breast cancer in her mother; Hypertension in her father and mother; Ovarian cysts in her sister; Parkinson's disease in her father; Prostate cancer in her father.   ROS:  Please see the history of present illness.   All other systems are personally reviewed and negative.    Exam:    Vital Signs:  BP 138/79   Pulse 68   Ht '5\' 1"'  (1.549 m)   Wt 167 lb (75.8 kg)   BMI 31.55 kg/m        Labs/Other Tests and Data Reviewed:    Recent Labs: 07/28/2020: TSH 1.450 09/05/2020: ALT 19; Hemoglobin 12.5; Platelets 299 01/07/2021: BUN 17; Creatinine, Ser 0.88; Potassium 4.5; Sodium 141   Wt Readings from Last 3 Encounters:  07/09/21 167 lb (75.8 kg)  02/18/21 167 lb (75.8 kg)  01/07/21 165 lb 3.2 oz (74.9 kg)     Other studies personally reviewed: Additional studies/ records that were reviewed today include:  (As above)     ASSESSMENT & PLAN:    Complete heart block-intermittent   Pacemaker-[employer] initially implanted for sinus node dysfunction   Atrial arrhythmias-recurrent on flecainide  Evaluation>>[facility] not thought to be Sarcoid  ? Bradycardia     Patient was able to update me on her evaluation at [facility] as noted above; unaware that she had been at [facility] I have reached out again to Dr. Burt Knack yesterday who acknowledged the concern regarding sarcoid.  I have reached out to him again for final clarification.  In the event that nothing further needs to be done regarding that, the abnormal PET and the new onset heart block, then atrial lead revision as replacement and not extraction is recommended by [facility] would be the next step.  The patient is quite anxious about her device behavior approaching and at [facility], apparently at her prior generator change she went in 5 days from ERI to not being able to interrogate the device.--Her experience trumps the reassurances of 3 months between ERI and any manifestations  of EOL.  Also discussed with the abandoning of the atrial lead, implications regarding MRI.  Also whether she would like to change to [employer] pulse generator for battery longevity--she will consider in the context of her scuba diving   She also describes bradycardia, 'my atrial lead is getting worse; "device interrogation with inappropriate mode switch was associated with ventricular pacing at 70.  Apart from PVCs, not previously documented, the only explanation I can think of would be ventricular oversensing.  We will get an event recorder to elucidate this as this would inform device generator replacement procedures  COVID 19 screen The patient denies symptoms of COVID 19 at this time.  The importance of social distancing was discussed today.  Follow-up:  3 weeks  Current medicines are reviewed at length with the patient today.   The patient does not have concerns regarding her medicines.  The following changes were made today:  none  Labs/ tests ordered today include: 14 day AT ZIO    Patient Risk:  after full review of this patients clinical status, I feel that they are at moderate risk at this time.  Today, I have spent 43 minutes with the patient with telehealth technology discussing the above AND reviewing the chart with discussions with Dr CL.  Signed, Virl Axe, MD  07/09/2021 8:37 AM     [address] [place]  [place] [address] [phone] (office) [phone] (fax)

## 2021-07-09 NOTE — Patient Instructions (Signed)
Medication Instructions:  -Your physician recommends that you continue on your current medications as directed. Please refer to the Current Medication list given to you today.  *If you need a refill on your cardiac medications before your next appointment, please call your pharmacy*   Lab Work: - none ordered  If you have labs (blood work) drawn today and your tests are completely normal, you will receive your results only by: MyChart Message (if you have MyChart) OR A paper copy in the mail If you have any lab test that is abnormal or we need to change your treatment, we will call you to review the results.   Testing/Procedures:  1) Heart monitor:  Type of monitor: ZIO AT (real time monitor) this will be mailed to your home address Length of Wear: 2 weeks  Your physician has recommended that you wear a Zio monitor.   This monitor is a medical device that records the heart's electrical activity. Doctors most often use these monitors to diagnose arrhythmias. Arrhythmias are problems with the speed or rhythm of the heartbeat. The monitor is a small device applied to your chest. You can wear one while you do your normal daily activities. While wearing this monitor if you have any symptoms to push the button and record what you felt. Once you have worn this monitor for the period of time provider prescribed (Usually 14 days), you will return the monitor device in the postage paid box. Once it is returned they will download the data collected and provide [place] with a report which the provider will then review and we will call you with those results. Important tips:  Avoid showering during the first 24 hours of wearing the monitor. Avoid excessive sweating to help maximize wear time. Do not submerge the device, no hot tubs, and no swimming pools. Keep any lotions or oils away from the patch. After 24 hours you may shower with the patch on. Take brief showers with your back facing the shower  head.  Do not remove patch once it has been placed because that will interrupt data and decrease adhesive wear time. Push the button when you have any symptoms and write down what you were feeling. Once you have completed wearing your monitor, remove and place into box which has postage paid and place in your outgoing mailbox.  If for some reason you have misplaced your box then call our office and we can provide another box and/or mail it off for you.      Follow-Up: At [facility], you and your health needs are our priority.  As part of our continuing mission to provide you with exceptional heart care, we have created designated Provider Care Teams.  These Care Teams include your primary Cardiologist (physician) and Advanced Practice Providers (APPs -  Physician Assistants and Nurse Practitioners) who all work together to provide you with the care you need, when you need it.  We recommend signing up for the patient portal called "MyChart".  Sign up information is provided on this After Visit Summary.  MyChart is used to connect with patients for Virtual Visits (Telemedicine).  Patients are able to view lab/test results, encounter notes, upcoming appointments, etc.  Non-urgent messages can be sent to your provider as well.   To learn more about what you can do with MyChart, go to [email or web address].    Your next appointment:   4-5 weeks (to allow time for the monitor to result)  The format for your next appointment:  In Person  Provider:   Sherryl Manges, MD    Other Instructions N/a

## 2021-07-10 ENCOUNTER — Ambulatory Visit: Payer: 59

## 2021-07-13 DIAGNOSIS — R001 Bradycardia, unspecified: Secondary | ICD-10-CM

## 2021-07-16 ENCOUNTER — Ambulatory Visit (INDEPENDENT_AMBULATORY_CARE_PROVIDER_SITE_OTHER): Payer: 59

## 2021-07-16 ENCOUNTER — Encounter: Payer: Self-pay | Admitting: Internal Medicine

## 2021-07-16 DIAGNOSIS — I442 Atrioventricular block, complete: Secondary | ICD-10-CM

## 2021-07-16 LAB — CUP PACEART REMOTE DEVICE CHECK
Battery Remaining Longevity: 1 mo
Battery Remaining Percentage: 1 %
Battery Voltage: 2.65 V
Brady Statistic AP VP Percent: 97 %
Brady Statistic AP VS Percent: 1 %
Brady Statistic AS VP Percent: 3 %
Brady Statistic AS VS Percent: 1 %
Brady Statistic RA Percent Paced: 97 %
Brady Statistic RV Percent Paced: 99 %
Date Time Interrogation Session: 20221208031149
Implantable Lead Implant Date: 20150114
Implantable Lead Implant Date: 20150114
Implantable Lead Location: 753859
Implantable Lead Location: 753860
Implantable Lead Model: 5068
Implantable Pulse Generator Implant Date: 20150114
Lead Channel Impedance Value: 400 Ohm
Lead Channel Impedance Value: 490 Ohm
Lead Channel Pacing Threshold Amplitude: 0.75 V
Lead Channel Pacing Threshold Amplitude: 1 V
Lead Channel Pacing Threshold Pulse Width: 0.6 ms
Lead Channel Pacing Threshold Pulse Width: 1 ms
Lead Channel Sensing Intrinsic Amplitude: 0.5 mV
Lead Channel Sensing Intrinsic Amplitude: 6.7 mV
Lead Channel Setting Pacing Amplitude: 1.5 V
Lead Channel Setting Pacing Amplitude: 3.25 V
Lead Channel Setting Pacing Pulse Width: 1 ms
Lead Channel Setting Sensing Sensitivity: 4 mV
Pulse Gen Model: 2240
Pulse Gen Serial Number: 7576209

## 2021-07-23 ENCOUNTER — Encounter: Payer: Self-pay | Admitting: Internal Medicine

## 2021-07-23 ENCOUNTER — Other Ambulatory Visit: Payer: Self-pay

## 2021-07-23 ENCOUNTER — Ambulatory Visit (INDEPENDENT_AMBULATORY_CARE_PROVIDER_SITE_OTHER): Payer: 59

## 2021-07-23 ENCOUNTER — Telehealth: Payer: Self-pay | Admitting: Physician Assistant

## 2021-07-23 DIAGNOSIS — I442 Atrioventricular block, complete: Secondary | ICD-10-CM

## 2021-07-23 LAB — CUP PACEART INCLINIC DEVICE CHECK
Battery Remaining Longevity: 0 mo
Battery Voltage: 2.65 V
Brady Statistic RA Percent Paced: 97 %
Brady Statistic RV Percent Paced: 99.93 %
Date Time Interrogation Session: 20221215162611
Implantable Lead Implant Date: 20150114
Implantable Lead Implant Date: 20150114
Implantable Lead Location: 753859
Implantable Lead Location: 753860
Implantable Lead Model: 5068
Implantable Pulse Generator Implant Date: 20150114
Lead Channel Impedance Value: 412.5 Ohm
Lead Channel Impedance Value: 487.5 Ohm
Lead Channel Pacing Threshold Amplitude: 0.5 V
Lead Channel Pacing Threshold Amplitude: 1 V
Lead Channel Pacing Threshold Pulse Width: 0.6 ms
Lead Channel Pacing Threshold Pulse Width: 1 ms
Lead Channel Setting Pacing Amplitude: 1.5 V
Lead Channel Setting Pacing Amplitude: 3.25 V
Lead Channel Setting Pacing Pulse Width: 1 ms
Lead Channel Setting Sensing Sensitivity: 4 mV
Pulse Gen Model: 2240
Pulse Gen Serial Number: 7576209

## 2021-07-23 NOTE — Progress Notes (Unsigned)
Spoke with pt, there have been discussions with Dr Excell Seltzer at [facility] about a final diagnosis.  It turns out [facility] sent two copies of the perfusion imaging and none of the metabolic images and so these need to be obtained by [facility] and reviewed prior to the replacement of her device as the question of sarcoid would inform that procedure with upgrade to an ICD in addition to a new atrial lead She understands and will work on getting the scans from [facility] to [facility]

## 2021-07-23 NOTE — Progress Notes (Signed)
Pacemaker check in clinic per patient request.  Report needed for upcoming visit with [facility]. Normal device function. Thresholds and impedances consistent with previous measurements. Device programmed to maximize longevity. 23 mode switch episodes, AT/AF Burden <1%.  Longest episode <10 seconds.  No high ventricular rates noted. Device programmed at appropriate safety margins. Histogram distribution appropriate for patient activity level. Estimated longevity <62months until ERI (Battery voltage 2.65V). Patient enrolled in remote follow-up, next scheduled check 09/02/21. Patient education completed.

## 2021-07-23 NOTE — Telephone Encounter (Signed)
Paged by Meredeth Ide [phone] regarding episode of complete heart block occurred at 3:06 PM [place] standard time.  Heart rate was 37 bpm, total duration 90 seconds. Abnormal reading occurred more than 3 hours ago. [employer] reached out to the patient at that time and patient reportedly was asymptomatic.  IR[employer] will forward EKG strip to Dr. Odessa Fleming office to review

## 2021-07-24 ENCOUNTER — Encounter: Payer: Self-pay | Admitting: Internal Medicine

## 2021-07-24 NOTE — Progress Notes (Signed)
Remote pacemaker transmission.   

## 2021-07-24 NOTE — Progress Notes (Unsigned)
Received staff message that there was inhibition and complete heart block \\turns  out this was recorded during her pacemaker interrogation--which she clarified as I spoke to her After all that :)))

## 2021-07-24 NOTE — Telephone Encounter (Signed)
Trying to get to view the strips,  this has been prevuiously reported by the patient and I suspect is related to lead failure with V lead oversensing and inhibition of pacing >> need to see if there is an intrinsic rhtyhm that explains HR 37 Plan is for system revision

## 2021-07-29 ENCOUNTER — Telehealth: Payer: Self-pay

## 2021-07-29 ENCOUNTER — Encounter: Payer: Self-pay | Admitting: Internal Medicine

## 2021-07-29 NOTE — Telephone Encounter (Signed)
The patient called to update [place] that after reviewing the scans the [facility] clinic thought it would be better for her to get an upgrade to an ICD instead of a pacemaker. I told her I will let Dr. Graciela Husbands know. She states she do not need a call back but if Dr. Graciela Husbands wants to call her he can. She just wanted to make sure we was up to date.

## 2021-07-30 NOTE — Telephone Encounter (Signed)
Dr. Graciela Husbands reached out to me on Friday 07/24/21 after 5 pm inquiring if I could access the patient's ZIO tracings from the reported event. Strips obtained and faxed to Dr. Graciela Husbands in the cath lab after hours.  I have been out of the office Monday/ Tuesday/ & Wednesday this week. However, Dr. Graciela Husbands reached out to me by phone later on 12/16 advising the patient's device was being interrogated in the office at the time of the recorded event reported by [employer].   Will forward to Dr. Graciela Husbands to inquired if there is anything else at this time I need to be doing for the patient.

## 2021-08-07 HISTORY — PX: PACEMAKER IMPLANT: EP1218

## 2021-08-11 NOTE — Telephone Encounter (Signed)
[facility] Salvia, MD [phone])  Sent: Wed August 05, 2021 11:50 AM  To: Jefferey Pica, RN          Message  Spoke with Dr Precious Bard and colleague at [facility] yesterday    They are scheduled to see the patient this week and will be undertaking change out-- I have alerted them to the issue of the atrial lead and they said they would interrogate and make the "right"decision

## 2021-08-14 ENCOUNTER — Encounter: Payer: Self-pay | Admitting: Internal Medicine

## 2021-08-18 ENCOUNTER — Encounter: Payer: 59 | Admitting: Internal Medicine

## 2021-09-01 ENCOUNTER — Encounter: Payer: Self-pay | Admitting: Internal Medicine

## 2021-09-01 ENCOUNTER — Other Ambulatory Visit: Payer: Self-pay

## 2021-09-01 ENCOUNTER — Ambulatory Visit (INDEPENDENT_AMBULATORY_CARE_PROVIDER_SITE_OTHER): Payer: 59 | Admitting: Internal Medicine

## 2021-09-01 VITALS — BP 126/90 | HR 62 | Ht 61.0 in | Wt 165.0 lb

## 2021-09-01 DIAGNOSIS — Z950 Presence of cardiac pacemaker: Secondary | ICD-10-CM

## 2021-09-01 DIAGNOSIS — I498 Other specified cardiac arrhythmias: Secondary | ICD-10-CM | POA: Diagnosis not present

## 2021-09-01 DIAGNOSIS — I442 Atrioventricular block, complete: Secondary | ICD-10-CM | POA: Diagnosis not present

## 2021-09-01 MED ORDER — CEPHALEXIN 500 MG PO CAPS: 500.0000 mg | ORAL_CAPSULE | Freq: Three times a day (TID) | ORAL | 0 refills | Status: DC

## 2021-09-01 NOTE — Patient Instructions (Addendum)
Medication Instructions:  - Your physician has recommended you make the following change in your medication:   1) START Keflex (Cephalexin) 500 mg: - take 1 capsule by mouth three times a day x 5 days  *If you need a refill on your cardiac medications before your next appointment, please call your pharmacy*   Lab Work: - none ordered  If you have labs (blood work) drawn today and your tests are completely normal, you will receive your results only by: [place] (if you have MyChart) OR A paper copy in the mail If you have any lab test that is abnormal or we need to change your treatment, we will call you to review the results.   Testing/Procedures: - none ordered   Follow-Up: At [facility], you and your health needs are our priority.  As part of our continuing mission to provide you with exceptional heart care, we have created designated Provider Care Teams.  These Care Teams include your primary Cardiologist (physician) and Advanced Practice Providers (APPs -  Physician Assistants and Nurse Practitioners) who all work together to provide you with the care you need, when you need it.  We recommend signing up for the patient portal called "MyChart".  Sign up information is provided on this After Visit Summary.  MyChart is used to connect with patients for Virtual Visits (Telemedicine).  Patients are able to view lab/test results, encounter notes, upcoming appointments, etc.  Non-urgent messages can be sent to your provider as well.   To learn more about what you can do with MyChart, go to [email or web address].    Your next appointment:   2 month(s)  The format for your next appointment:   In Person  Provider:   Virl Axe, MD    Other Instructions  Cephalexin Capsules or Tablets What is this medication? CEPHALEXIN (sef a LEX in) treats infections caused by bacteria. It belongs to a group of medications called cephalosporin antibiotics. It will not treat colds,  the flu, or infections caused by viruses. This medicine may be used for other purposes; ask your health care provider or pharmacist if you have questions. COMMON BRAND NAME(S): Biocef, Daxbia, Keflex, Keftab What should I tell my care team before I take this medication? They need to know if you have any of these conditions: Bleeding disorder Kidney disease Liver disease Seizures Stomach or intestine problems like colitis An unusual or allergic reaction to cephalexin, other penicillin or cephalosporin antibiotics, other medications, foods, dyes, or preservatives Pregnant or trying to get pregnant Breast-feeding How should I use this medication? Take this medication by mouth. Take it as directed on the prescription label at the same time every day. You can take it with or without food. If it upsets your stomach, take it with food. Take all of this medication unless your care team tells you to stop it early. Keep taking it even if you think you are better. Talk to your care team about the use of this medication in children. While it may be prescribed for selected conditions, precautions do apply. Overdosage: If you think you have taken too much of this medicine contact a poison control center or emergency room at once. NOTE: This medicine is only for you. Do not share this medicine with others. What if I miss a dose? If you miss a dose, take it as soon as you can. If it is almost time for your next dose, take only that dose. Do not take double or extra doses. What  may interact with this medication? Probenecid Some other antibiotics This list may not describe all possible interactions. Give your health care provider a list of all the medicines, herbs, non-prescription drugs, or dietary supplements you use. Also tell them if you smoke, drink alcohol, or use illegal drugs. Some items may interact with your medicine. What should I watch for while using this medication? Tell your care team if your  symptoms do not start to get better or if they get worse. Do not treat diarrhea with over the counter products. Contact your care team if you have diarrhea that lasts more than 2 days or if it is severe and watery. This medication may cause serious skin reactions. They can happen weeks to months after starting the medication. Contact your care team right away if you notice fevers or flu-like symptoms with a rash. The rash may be red or purple and then turn into blisters or peeling of the skin. Or, you might notice a red rash with swelling of the face, lips or lymph nodes in your neck or under your arms. If you have diabetes, you may get a false-positive result for sugar in your urine. Check with your care team. What side effects may I notice from receiving this medication? Side effects that you should report to your care team as soon as possible: Allergic reactions--skin rash, itching, hives, swelling of the face, lips, tongue, or throat Redness, blistering, peeling, or loosening of the skin, including inside the mouth Severe diarrhea, fever Unusual vaginal discharge, itching, or odor Side effects that usually do not require medical attention (report to your care team if they continue or are bothersome): Diarrhea Headache Nausea This list may not describe all possible side effects. Call your doctor for medical advice about side effects. You may report side effects to FDA at 1-800-FDA-1088. Where should I keep my medication? Keep out of the reach of children and pets. Store at room temperature between 20 and 25 degrees C (68 and 77 degrees F). Throw away any unused medication after the expiration date. NOTE: This sheet is a summary. It may not cover all possible information. If you have questions about this medicine, talk to your doctor, pharmacist, or health care provider.  2022 Elsevier/Gold Standard (2020-07-21 00:00:00)

## 2021-09-02 NOTE — Progress Notes (Signed)
Patient Care Team: Patient, No Pcp Per (Inactive) as PCP - General (General Practice) Emma Sable, MD as PCP - Cardiology (Cardiology) Emma Epley, MD as PCP - Electrophysiology (Cardiology)   HPI  Emma Long is a 46 y.o. female seen to establish pacemaker follow-up.  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  And their devices were rated to no deformation at 7 atm.  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  Most recent evaluation by Dr. Sampson Long at [facility] remains incomplete as he has not seen both sets of PET images obtained at [facility].  It was his recommendation however, not withstanding that, that her device generator replacement which was undertaken at [facility] 12/22 not be associated with an upgrade to an ICD  Doing quite well now without shortness of breath or chest pain   Has just undergone knee replacement surgery.  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, 55 yo twins, Emma Long and Emma Long ( girl)    DATE TEST EF    11/21 Echo  60-65%               Date Cr K Hgb  12/21 0.86 4.3 14.5    DATE PR interval QRSduration Dose  11/21 AVp AVp 50    4/22 AVp AVp 50       Records and Results Reviewed   Past Medical History:  Diagnosis Date   ADHD    Arrhythmia    Atrial flutter ([place])    Chicken pox    Heart disease 06/30/2020   Pacemaker 02/1999   Pain in left knee 03/06/2018   Sinoatrial node dysfunction [facility])     Past Surgical History:  Procedure Laterality Date   APPLICATION OF WOUND VAC Left 09/22/2020   Procedure: APPLICATION OF WOUND VAC;  Surgeon: Emma Leep, MD;   Location: [facility] ORS;  Service: Orthopedics;  Laterality: Left;   ARTHROSCOPIC REPAIR ACL Left 1996   BILATERAL CARPAL TUNNEL RELEASE Bilateral 2007   CARDIAC ELECTROPHYSIOLOGY STUDY AND ABLATION  1996-1999   x4   HYSTEROSCOPY  2004   essure placement   INSERT / REPLACE / REMOVE PACEMAKER  02/1999   SA node blocked from scar tissue formed after 4th ablation   KNEE ARTHROPLASTY Left 09/22/2020   Procedure: COMPUTER ASSISTED TOTAL KNEE ARTHROPLASTY;  Surgeon: Emma Leep, MD;  Location: [facility] ORS;  Service: Orthopedics;  Laterality: Left;   LIPOMA EXCISION  2001   left scapula   PACEMAKER IMPLANT  08/07/2021   [employer]   TONSILLECTOMY AND ADENOIDECTOMY  1992    Current Meds  Medication Sig   amphetamine-dextroamphetamine (ADDERALL) 20 MG tablet Take 20 mg by mouth 2 (two) times daily as needed (attention/focus).   ascorbic acid (VITAMIN C) 1000 MG tablet Take 1,000 mg by mouth daily.   cephALEXin (KEFLEX) 500 MG capsule Take 1 capsule (500 mg total) by mouth 3 (three) times daily.   cholecalciferol (VITAMIN D) 25 MCG (1000 UNIT) tablet Take 3,000-5,000 Units by mouth daily.   diclofenac Sodium (VOLTAREN) 1 % GEL Apply topically as  needed.   flecainide (TAMBOCOR) 50 MG tablet Take 50 mg by mouth daily.   lidocaine-prilocaine (EMLA) cream Apply 1 application topically 2 (two) times daily as needed (plantar fasciitis pain.).   metoprolol succinate (TOPROL-XL) 25 MG 24 hr tablet Take 1 tablet (25 mg total) by mouth daily.   VYVANSE 70 MG capsule Take 70 mg by mouth daily.   Zinc 50 MG TABS Take 50 mg by mouth daily.    Allergies  Allergen Reactions   Iodinated Contrast Media Itching and Other (See Comments)    Itchy throat, sneezing,abdominal pain    Other Other (See Comments)    Vicryl sutures- dishiscence   Tape Rash    Paper tape   Wound Dressing Adhesive Rash      Review of Systems negative except from HPI and PMH  Physical Exam BP 126/90 (BP Location: Left  Arm, Patient Position: Sitting, Cuff Size: Normal)    Pulse 62    Ht '5\' 1"'  (1.549 m)    Wt 165 lb (74.8 kg)    SpO2 99%    BMI 31.18 kg/m  Well developed and well nourished in no acute distress HENT normal Neck supple with JVP-flat Clear Device pocket well healed; without hematoma or erythema.  There is no tethering the [employer] zip bandage has already been removed Regular rate and rhythm, no murmur Abd-soft with active BS No Clubbing cyanosis  edema Skin-warm and dry A & Oriented  Grossly normal sensory and motor function  ECG AV pacing   CrCl cannot be calculated (Patient's most recent lab result is older than the maximum 21 days allowed.).   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  Received a Product/process development scientist.  Heart rate excursion demonstrated to be very blunted.  Max sensor rate was increased from 140--160 and exertional response and minute ventilation response for both increased.        Current medicines are reviewed at length with the patient today .  The patient does not  have concerns regarding medicines.

## 2021-09-21 ENCOUNTER — Telehealth: Payer: Self-pay | Admitting: Internal Medicine

## 2021-09-21 ENCOUNTER — Other Ambulatory Visit: Payer: Self-pay

## 2021-09-21 ENCOUNTER — Telehealth: Payer: Self-pay

## 2021-09-21 NOTE — Telephone Encounter (Signed)
Flecainide is typically taken as a BID medication.  I have sent the patient a MyChart message to please clarify how she is taking this.   Awaiting her response.

## 2021-09-21 NOTE — Telephone Encounter (Signed)
Called patient (09/18/21) to inform her that we have forms to be completed from [employer] Patient states she will be by to sign forms and pay $29 fee

## 2021-09-21 NOTE — Telephone Encounter (Signed)
Please advise on directions of flecainide. Per 09/01/21 AVS patient is to take once daily. Listed under historical provider so just wanted to verify first please. Thank you!   *STAT* If patient is at the pharmacy, call can be transferred to refill team.   1. Which medications need to be refilled? (please list name of each medication and dose if known) flecainide 50mg  BID  2. Which pharmacy/location (including street and city if local pharmacy) is medication to be sent to? [facility] [place]  3. Do they need a 30 day or 90 day supply? 30

## 2021-09-29 ENCOUNTER — Other Ambulatory Visit: Payer: Self-pay | Admitting: Nurse Practitioner

## 2021-09-29 DIAGNOSIS — Z1231 Encounter for screening mammogram for malignant neoplasm of breast: Secondary | ICD-10-CM

## 2021-10-06 ENCOUNTER — Other Ambulatory Visit: Payer: Self-pay | Admitting: *Deleted

## 2021-10-06 MED ORDER — FLECAINIDE ACETATE 50 MG PO TABS: ORAL_TABLET | ORAL | 1 refills | Status: DC

## 2021-10-22 ENCOUNTER — Ambulatory Visit
Admission: RE | Admit: 2021-10-22 | Discharge: 2021-10-22 | Disposition: A | Discharge status: Home / Self Care | Payer: 59 | Source: Ambulatory Visit | Attending: Nurse Practitioner | Admitting: Nurse Practitioner

## 2021-10-22 ENCOUNTER — Other Ambulatory Visit: Payer: Self-pay

## 2021-10-22 DIAGNOSIS — Z1231 Encounter for screening mammogram for malignant neoplasm of breast: Secondary | ICD-10-CM

## 2021-11-10 ENCOUNTER — Ambulatory Visit (INDEPENDENT_AMBULATORY_CARE_PROVIDER_SITE_OTHER): Payer: Commercial Managed Care - PPO | Admitting: Internal Medicine

## 2021-11-10 ENCOUNTER — Encounter: Payer: Self-pay | Admitting: Internal Medicine

## 2021-11-10 VITALS — BP 138/88 | HR 60 | Ht 61.0 in | Wt 161.0 lb

## 2021-11-10 DIAGNOSIS — I442 Atrioventricular block, complete: Secondary | ICD-10-CM

## 2021-11-10 DIAGNOSIS — I498 Other specified cardiac arrhythmias: Secondary | ICD-10-CM

## 2021-11-10 DIAGNOSIS — Z950 Presence of cardiac pacemaker: Secondary | ICD-10-CM

## 2021-11-10 NOTE — Progress Notes (Signed)
? ? ? ? ?Patient Care Team: ?Patient, No Pcp Per (Inactive) as PCP - General (General Practice) ?Kate Sable, MD as PCP - Cardiology (Cardiology) ?Vickie Epley, MD as PCP - Electrophysiology (Cardiology) ? ? ?HPI ? ?Emma Long is a 46 y.o. 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 ?Lengthy process of trying to understand whether she had cardiac sarcoid outlined in prior notes.  Most recent evaluation by Dr. Sampson Si at [facility] remains incomplete as he has not seen both sets of PET images obtained at [facility].  It was his recommendation however, not withstanding that, that her device generator replacement which was undertaken at [facility] 12/22 not be associated with an upgrade to an ICD; and not withstanding atrial lead issues, no revision of her atrial lead was made (for some reason, care everywhere notes from [facility] are not available in epic) ? ?History of SVT for which she underwent a series of ablations at [facility] in the late 1990s, ultimately treated with  ?flecainide ? ?Doing better following reprogramming of rate response.  Could do still more she thinks. ? ?Has just undergone knee replacement surgery.  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  ?DATE TEST EF    ?11/21 Echo  60-65%    ?6/22  PET (CE)   normal  Inflammation 37% ?Scar < 5%   ?  ?Date Cr K Hgb LDL  ?12/21 0.86 4.3 14.5 145 ( 12/21)  ?6/22 0.88 4.5 12.5   ?  ?DATE PR interval QRSduration Dose  ?11/21 AVp AVp 50   ? 4/22 AVp AVp 50  ?     ? ? ? ? ? ?Records and Results Reviewed  ? ?Past Medical History:  ?Diagnosis Date  ? ADHD   ? Arrhythmia   ? Atrial flutter ([place])   ? Chicken pox   ? Heart disease 06/30/2020  ? Pacemaker 02/1999  ? Pain in left knee 03/06/2018  ?  Sinoatrial node dysfunction ([facility])   ? ? ?Past Surgical History:  ?Procedure Laterality Date  ? APPLICATION OF WOUND VAC Left 09/22/2020  ? Procedure: APPLICATION OF WOUND VAC;  Surgeon: Dereck Leep, MD;  Location: [facility] ORS;  Service: Orthopedics;  Laterality: Left;  ? ARTHROSCOPIC REPAIR ACL Left 1996  ? BILATERAL CARPAL TUNNEL RELEASE Bilateral 2007  ? CARDIAC ELECTROPHYSIOLOGY STUDY AND ABLATION  [id number]  ? x4  ? HYSTEROSCOPY  2004  ? essure placement  ? INSERT / REPLACE / REMOVE PACEMAKER  02/1999  ? SA node blocked from scar tissue formed after 4th ablation  ? KNEE ARTHROPLASTY Left 09/22/2020  ? Procedure: COMPUTER ASSISTED TOTAL KNEE ARTHROPLASTY;  Surgeon: Dereck Leep, MD;  Location: [facility] ORS;  Service: Orthopedics;  Laterality: Left;  ? LIPOMA EXCISION  2001  ? left scapula  ? PACEMAKER IMPLANT  08/07/2021  ? [place]  ? TONSILLECTOMY AND ADENOIDECTOMY  1992  ? ? ?Current Meds  ?Medication Sig  ? amphetamine-dextroamphetamine (ADDERALL) 20 MG tablet Take 20 mg by mouth 2 (two) times daily as needed (attention/focus).  ? ascorbic acid (VITAMIN C) 1000 MG tablet Take 1,000 mg by mouth daily.  ? cholecalciferol (VITAMIN D) 25 MCG (1000 UNIT)  tablet Take 3,000-5,000 Units by mouth daily.  ? diclofenac Sodium (VOLTAREN) 1 % GEL Apply topically as needed.  ? flecainide (TAMBOCOR) 50 MG tablet Take 0.5 tablet (25 mg) by mouth twice daily  ? lidocaine-prilocaine (EMLA) cream Apply 1 application topically 2 (two) times daily as needed (plantar fasciitis pain.).  ? metoprolol succinate (TOPROL-XL) 25 MG 24 hr tablet Take 1 tablet (25 mg total) by mouth daily.  ? VYVANSE 70 MG capsule Take 70 mg by mouth daily.  ? Zinc 50 MG TABS Take 50 mg by mouth daily.  ? ? ?Allergies  ?Allergen Reactions  ? Iodinated Contrast Media Itching and Other (See Comments)  ?  Itchy throat, sneezing,abdominal pain   ? Other Other (See Comments)  ?  Vicryl sutures- dishiscence  ? Tape Rash  ?  Paper tape  ? Wound Dressing  Adhesive Rash  ? ? ? ? ?Review of Systems negative except from HPI and PMH ? ?Physical Exam ?BP 138/88 (BP Location: Left Arm, Patient Position: Sitting, Cuff Size: Normal)   Pulse 60   Ht 5\' 1"  (1.549 m)   Wt 161 lb (73 kg)   SpO2 98%   BMI 30.42 kg/m?  ?Well developed and well nourished in no acute distress ?HENT normal ?Neck supple with JVP-flat ?Clear ?Device pocket well healed; without hematoma or erythema.  There is no tethering  ?Regular rate and rhythm, no gallop No / murmur ?Abd-soft with active BS ?No Clubbing cyanosis  edema ?Skin-warm and dry ?A & Oriented  Grossly normal sensory and motor function ? ?ECG AV pacing ? ?CrCl cannot be calculated (Patient's most recent lab result is older than the maximum 21 days allowed.). ? ? ?Assessment and  Plan ?Complete heart block-intermittent ? ?Abnormal PET with inflammation-37% (6/22) concerning for sarcoid ? ?Pacemaker- initially implanted for sinus node dysfunction [employer]  ? ?Atrial arrhythmias-recurrent on flecainide ? 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 ?Ventricular lead exit block ? ?The patient has felt much better.  Would like to see if we could further augment exercise capacity by changing her program, we have decreased her threshold from medium to medium low and have increased the rate response from 10--12 with both ventilation and accelerometer ? ?I will reach out to Dr. Burt Knack to ask whether repeat PET scanning is valuable in trying to 6 consider immunosuppressive therapy for her ongoing cardiac inflammation if present ? ?Ventricular lead threshold is an issue.  Battery longevity with reprogramming is about 8 years.  I reviewed with her the benefits of the absence of a voltage doubler   ?  ? ? ? ? ? ?Current medicines are reviewed at length with the patient today .  The patient does not  have concerns regarding medicines. ? ?

## 2021-11-10 NOTE — Patient Instructions (Signed)
Medication Instructions:  ?- Your physician recommends that you continue on your current medications as directed. Please refer to the Current Medication list given to you today. ? ?*If you need a refill on your cardiac medications before your next appointment, please call your pharmacy* ? ? ?Lab Work: ?- none ordered ? ?If you have labs (blood work) drawn today and your tests are completely normal, you will receive your results only by: ?MyChart Message (if you have MyChart) OR ?A paper copy in the mail ?If you have any lab test that is abnormal or we need to change your treatment, we will call you to review the results. ? ? ?Testing/Procedures: ?- none ordered ? ? ?Follow-Up: ?At [facility], you and your health needs are our priority.  As part of our continuing mission to provide you with exceptional heart care, we have created designated Provider Care Teams.  These Care Teams include your primary Cardiologist (physician) and Advanced Practice Providers (APPs -  Physician Assistants and Nurse Practitioners) who all work together to provide you with the care you need, when you need it. ? ?We recommend signing up for the patient portal called "MyChart".  Sign up information is provided on this After Visit Summary.  MyChart is used to connect with patients for Virtual Visits (Telemedicine).  Patients are able to view lab/test results, encounter notes, upcoming appointments, etc.  Non-urgent messages can be sent to your provider as well.   ?To learn more about what you can do with MyChart, go to [email or web address].   ? ?Your next appointment:   ?9 month(s) ? ?The format for your next appointment:   ?In person ? ?Provider:   ?Sherryl Manges, MD  ? ? ?Other Instructions ?N/a ? ?

## 2021-11-13 LAB — CUP PACEART INCLINIC DEVICE CHECK
Date Time Interrogation Session: 20230404000000
Implantable Lead Implant Date: 20150114
Implantable Lead Implant Date: 20150114
Implantable Lead Location: 753859
Implantable Lead Location: 753860
Implantable Lead Model: 5068
Implantable Pulse Generator Implant Date: 20221230
Lead Channel Impedance Value: 527 Ohm
Lead Channel Impedance Value: 559 Ohm
Lead Channel Pacing Threshold Amplitude: 1.3 V
Lead Channel Pacing Threshold Amplitude: 1.9 V
Lead Channel Pacing Threshold Pulse Width: 0.4 ms
Lead Channel Pacing Threshold Pulse Width: 1.5 ms
Lead Channel Sensing Intrinsic Amplitude: 1.5 mV
Lead Channel Setting Pacing Amplitude: 2.5 V
Lead Channel Setting Pacing Amplitude: 3.5 V
Lead Channel Setting Pacing Pulse Width: 1.5 ms
Lead Channel Setting Sensing Sensitivity: 2.5 mV
Pulse Gen Serial Number: 567309

## 2021-11-19 ENCOUNTER — Ambulatory Visit (INDEPENDENT_AMBULATORY_CARE_PROVIDER_SITE_OTHER): Payer: 59

## 2021-11-19 DIAGNOSIS — I442 Atrioventricular block, complete: Secondary | ICD-10-CM

## 2021-11-19 LAB — CUP PACEART REMOTE DEVICE CHECK
Battery Remaining Longevity: 96 mo
Battery Remaining Percentage: 95 %
Brady Statistic RA Percent Paced: 99 %
Brady Statistic RV Percent Paced: 100 %
Date Time Interrogation Session: 20230413011100
Implantable Lead Implant Date: 20150114
Implantable Lead Implant Date: 20150114
Implantable Lead Location: 753859
Implantable Lead Location: 753860
Implantable Lead Model: 5068
Implantable Pulse Generator Implant Date: 20221230
Lead Channel Impedance Value: 529 Ohm
Lead Channel Impedance Value: 567 Ohm
Lead Channel Setting Pacing Amplitude: 2.5 V
Lead Channel Setting Pacing Amplitude: 3.5 V
Lead Channel Setting Pacing Pulse Width: 1.5 ms
Lead Channel Setting Sensing Sensitivity: 2.5 mV
Pulse Gen Serial Number: 567309

## 2021-12-07 NOTE — Progress Notes (Signed)
Remote pacemaker transmission.   

## 2022-01-15 NOTE — Addendum Note (Signed)
Addended by: Geralyn Flash D on: 01/15/2022 11:23 AM   Modules accepted: Level of Service

## 2022-02-18 ENCOUNTER — Ambulatory Visit (INDEPENDENT_AMBULATORY_CARE_PROVIDER_SITE_OTHER): Payer: 59

## 2022-02-18 DIAGNOSIS — I442 Atrioventricular block, complete: Secondary | ICD-10-CM

## 2022-02-22 LAB — CUP PACEART REMOTE DEVICE CHECK
Battery Remaining Longevity: 90 mo
Battery Remaining Percentage: 88 %
Brady Statistic RA Percent Paced: 99 %
Brady Statistic RV Percent Paced: 100 %
Date Time Interrogation Session: 20230715211300
Implantable Lead Implant Date: 20150114
Implantable Lead Implant Date: 20150114
Implantable Lead Location: 753859
Implantable Lead Location: 753860
Implantable Lead Model: 5068
Implantable Pulse Generator Implant Date: 20221230
Lead Channel Impedance Value: 527 Ohm
Lead Channel Impedance Value: 567 Ohm
Lead Channel Setting Pacing Amplitude: 2.5 V
Lead Channel Setting Pacing Amplitude: 3.5 V
Lead Channel Setting Pacing Pulse Width: 1.5 ms
Lead Channel Setting Sensing Sensitivity: 2.5 mV
Pulse Gen Serial Number: 567309

## 2022-03-05 NOTE — Progress Notes (Signed)
Remote pacemaker transmission.   

## 2022-03-23 ENCOUNTER — Telehealth: Payer: Self-pay | Admitting: Cardiology

## 2022-03-23 NOTE — Telephone Encounter (Signed)
-----   Message from Andi Devon sent at 03/22/2022  2:33 PM EDT ----- Regarding: needs appt Please schedule overdue F/U appt with Dr. Azucena Cecil for refills. Thank you!

## 2022-03-23 NOTE — Telephone Encounter (Signed)
Patient does not wish to follow up with general cardio, Dr Azucena Cecil at this time States that if needed she will call back to schedule

## 2022-03-23 NOTE — Telephone Encounter (Signed)
Pharmacy requesting refill of metoprolol. Please advise. (See below)

## 2022-03-29 ENCOUNTER — Encounter: Payer: Self-pay | Admitting: Internal Medicine

## 2022-03-29 MED ORDER — METOPROLOL SUCCINATE ER 25 MG PO TB24: 25.0000 mg | ORAL_TABLET | Freq: Every day | ORAL | 1 refills | Status: DC

## 2022-05-20 ENCOUNTER — Ambulatory Visit (INDEPENDENT_AMBULATORY_CARE_PROVIDER_SITE_OTHER): Payer: Commercial Managed Care - PPO

## 2022-05-20 DIAGNOSIS — I442 Atrioventricular block, complete: Secondary | ICD-10-CM | POA: Diagnosis not present

## 2022-05-20 LAB — CUP PACEART REMOTE DEVICE CHECK
Battery Remaining Longevity: 90 mo
Battery Remaining Percentage: 85 %
Brady Statistic RA Percent Paced: 98 %
Brady Statistic RV Percent Paced: 100 %
Date Time Interrogation Session: 20231012011200
Implantable Lead Implant Date: 20150114
Implantable Lead Implant Date: 20150114
Implantable Lead Location: 753859
Implantable Lead Location: 753860
Implantable Lead Model: 5068
Implantable Pulse Generator Implant Date: 20221230
Lead Channel Impedance Value: 530 Ohm
Lead Channel Impedance Value: 543 Ohm
Lead Channel Setting Pacing Amplitude: 2.5 V
Lead Channel Setting Pacing Amplitude: 3.5 V
Lead Channel Setting Pacing Pulse Width: 1.5 ms
Lead Channel Setting Sensing Sensitivity: 2.5 mV
Pulse Gen Serial Number: 567309

## 2022-05-27 NOTE — Progress Notes (Signed)
Remote pacemaker transmission.   

## 2022-06-16 ENCOUNTER — Ambulatory Visit: Payer: Commercial Managed Care - PPO | Admitting: Family Medicine

## 2022-06-16 ENCOUNTER — Other Ambulatory Visit (HOSPITAL_COMMUNITY)
Admission: RE | Admit: 2022-06-16 | Discharge: 2022-06-16 | Disposition: A | Discharge status: Home / Self Care | Payer: Commercial Managed Care - PPO | Source: Ambulatory Visit | Attending: Family Medicine | Admitting: Family Medicine

## 2022-06-16 ENCOUNTER — Encounter: Payer: Self-pay | Admitting: Family Medicine

## 2022-06-16 VITALS — BP 126/84 | HR 93 | Wt 159.0 lb

## 2022-06-16 DIAGNOSIS — N950 Postmenopausal bleeding: Secondary | ICD-10-CM | POA: Diagnosis present

## 2022-06-16 DIAGNOSIS — Z124 Encounter for screening for malignant neoplasm of cervix: Secondary | ICD-10-CM

## 2022-06-16 DIAGNOSIS — Z01411 Encounter for gynecological examination (general) (routine) with abnormal findings: Secondary | ICD-10-CM | POA: Diagnosis not present

## 2022-06-16 NOTE — Progress Notes (Signed)
Annual   Pt recently had bleeding on 05/29/22   last cycle was 08/2020.  Lasted 5 days. Tender/sore Breast.   Last pap:07/28/2020 WNL   Mammogram: 10/22/2021

## 2022-06-16 NOTE — Progress Notes (Signed)
Subjective:    Patient ID: Emma Long is a 46 y.o. female presenting with No chief complaint on file.  on 06/16/2022  HPI: Patient was > 1 year without a cycle. Previously perimenopause and FSH of 36 in 2021. Drinking fermented milk to help with gut health. Got breast tenderness and cycle back in October.  Not postcoital bleeding.   Review of Systems  Constitutional:  Negative for chills and fever.  Respiratory:  Negative for shortness of breath.   Cardiovascular:  Negative for chest pain.  Gastrointestinal:  Negative for abdominal pain, nausea and vomiting.  Genitourinary:  Positive for menstrual problem. Negative for dysuria.  Skin:  Negative for rash.      Objective:    BP 126/84   Pulse 93   Wt 159 lb (72.1 kg)   BMI 30.04 kg/m  Physical Exam Exam conducted with a chaperone present.  Constitutional:      General: She is not in acute distress.    Appearance: She is well-developed.  HENT:     Head: Normocephalic and atraumatic.  Eyes:     General: No scleral icterus.    Pupils: Pupils are equal, round, and reactive to light.  Neck:     Thyroid: No thyromegaly.  Cardiovascular:     Rate and Rhythm: Normal rate and regular rhythm.  Pulmonary:     Effort: Pulmonary effort is normal.     Breath sounds: Normal breath sounds.  Chest:  Breasts:    Tanner Score is 5.     Right: Normal. No inverted nipple, mass or skin change.     Left: Normal. No inverted nipple, mass or skin change.  Abdominal:     General: There is no distension.     Palpations: Abdomen is soft.     Tenderness: There is no abdominal tenderness.  Genitourinary:    Comments: BUS normal, vagina is pink, cervix is nulliparous without lesion, uterus is small and anteverted, no adnexal mass or tenderness.  Musculoskeletal:     Cervical back: Normal range of motion and neck supple.  Skin:    General: Skin is warm and dry.  Neurological:     Mental Status: She is alert and oriented to person,  place, and time.    Procedure: Patient given informed consent, signed copy in the chart, time out was performed. Appropriate time out taken. . The patient was placed in the lithotomy position and the cervix brought into view with sterile speculum.  Portio of cervix cleansed x 2 with betadine swabs.  A tenaculum was placed in the anterior lip of the cervix.  The uterus was sounded for depth of 6 cm. A pipelle was introduced to into the uterus, suction created,  and an endometrial sample was obtained. All equipment was removed and accounted for.  The patient tolerated the procedure well.      Assessment & Plan:   Problem List Items Addressed This Visit       Unprioritized   Postmenopausal bleeding    99213 - EMB today. Check u/s and if abnormal or bleeding is persistent, consider D & C with endometrial sampling. Check FSH to ensure she is menopausal. If bx and u/s are WNL, will monitor, likely from atrophy. Not a lot of tissue, so suspicion is low.       Relevant Orders   Surgical pathology( [place]/ [facility])   US PELVIC COMPLETE WITH TRANSVAGINAL   Follicle stimulating hormone   Other Visit Diagnoses  Encounter for gynecological examination with abnormal finding    -  Primary   had normal mammogram in 3/23 - colonoscopy 8 years ago and WNL   Screening for cervical cancer       Needs pap smear   Relevant Orders   Cytology - PAP( [place])       Return in about 1 year (around 06/17/2023).  Reva Bores, MD 06/16/2022 4:11 PM

## 2022-06-16 NOTE — Assessment & Plan Note (Addendum)
59292 - EMB today. Check u/s and if abnormal or bleeding is persistent, consider D & C with endometrial sampling. Check FSH to ensure she is menopausal. If bx and u/s are WNL, will monitor, likely from atrophy. Not a lot of tissue, so suspicion is low.

## 2022-06-18 LAB — FOLLICLE STIMULATING HORMONE: FSH: 50.5 m[IU]/mL

## 2022-06-21 ENCOUNTER — Ambulatory Visit
Admission: RE | Admit: 2022-06-21 | Discharge: 2022-06-21 | Disposition: A | Discharge status: Home / Self Care | Payer: Commercial Managed Care - PPO | Source: Ambulatory Visit | Attending: Family Medicine | Admitting: Family Medicine

## 2022-06-21 DIAGNOSIS — N950 Postmenopausal bleeding: Secondary | ICD-10-CM | POA: Insufficient documentation

## 2022-06-21 LAB — SURGICAL PATHOLOGY

## 2022-06-21 LAB — CYTOLOGY - PAP
Comment: NEGATIVE
Diagnosis: NEGATIVE
High risk HPV: NEGATIVE

## 2022-07-10 ENCOUNTER — Other Ambulatory Visit: Payer: Self-pay | Admitting: Internal Medicine

## 2022-07-12 NOTE — Telephone Encounter (Signed)
Please schedule F/U appt with Dr. Graciela Husbands for further refills. Thank you!

## 2022-08-19 ENCOUNTER — Ambulatory Visit (INDEPENDENT_AMBULATORY_CARE_PROVIDER_SITE_OTHER): Payer: 59

## 2022-08-19 DIAGNOSIS — I442 Atrioventricular block, complete: Secondary | ICD-10-CM

## 2022-08-20 LAB — CUP PACEART REMOTE DEVICE CHECK
Battery Remaining Longevity: 84 mo
Battery Remaining Percentage: 82 %
Brady Statistic RA Percent Paced: 98 %
Brady Statistic RV Percent Paced: 100 %
Date Time Interrogation Session: 20240111011000
Implantable Lead Connection Status: 753985
Implantable Lead Connection Status: 753985
Implantable Lead Implant Date: 20150114
Implantable Lead Implant Date: 20150114
Implantable Lead Location: 753859
Implantable Lead Location: 753860
Implantable Lead Model: 5068
Implantable Pulse Generator Implant Date: 20221230
Lead Channel Impedance Value: 533 Ohm
Lead Channel Impedance Value: 541 Ohm
Lead Channel Setting Pacing Amplitude: 2.5 V
Lead Channel Setting Pacing Amplitude: 3.5 V
Lead Channel Setting Pacing Pulse Width: 1.5 ms
Lead Channel Setting Sensing Sensitivity: 2.5 mV
Pulse Gen Serial Number: 567309
Zone Setting Status: 755011

## 2022-09-08 NOTE — Progress Notes (Signed)
Remote pacemaker transmission.   

## 2022-09-14 ENCOUNTER — Ambulatory Visit: Payer: Commercial Managed Care - PPO | Attending: Internal Medicine | Admitting: Internal Medicine

## 2022-09-14 VITALS — BP 100/70 | HR 61 | Ht 61.0 in | Wt 162.4 lb

## 2022-09-14 DIAGNOSIS — I495 Sick sinus syndrome: Secondary | ICD-10-CM | POA: Diagnosis not present

## 2022-09-14 DIAGNOSIS — Z950 Presence of cardiac pacemaker: Secondary | ICD-10-CM

## 2022-09-14 DIAGNOSIS — I442 Atrioventricular block, complete: Secondary | ICD-10-CM

## 2022-09-14 LAB — PACEMAKER DEVICE OBSERVATION

## 2022-09-14 NOTE — Progress Notes (Signed)
Patient Care Team: [facility] as PCP - General Emma Sable, MD as PCP - Cardiology (Cardiology) Emma Epley, MD as PCP - Electrophysiology (Cardiology)   HPI  Emma Long is a 47 y.o. 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  Most recent evaluation by Dr. Sampson Si at [facility] remains incomplete as he has not seen both sets of PET images obtained at [facility].  It was his recommendation however, not withstanding that, that her device generator replacement which was undertaken at [facility] 12/22 not be associated with an upgrade to an ICD; and not withstanding atrial lead issues, no revision of her atrial lead was made (for some reason, care everywhere notes from [facility] are not available in epic)' His comments, although we cannot find them in epic, were that her risks were exceedingly low with the image stated that he had  History of SVT for which she underwent a series of ablations at [facility] in the late 1990s, ultimately treated with flecainide  Felt better following reprogramming of her rate response, however, at her last visit we tried to augment it still, now she has tachypalpitations with modest exertion.  Has just undergone knee replacement surgery.  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, 48 yo twins, [name] he is in welding school and she is at [employer]    11/21 Echo  60-65%    6/22  PET (CE)   normal  Inflammation 37% Scar < 5%     Date Cr K Hgb LDL  12/21 0.86 4.3 14.5 145 ( 12/21)  6/22 0.88 4.5 12.5         Records and Results Reviewed   Past Medical History:  Diagnosis  Date   ADHD    Arrhythmia    Atrial flutter ([place])    Chicken pox    Heart disease 06/30/2020   Pacemaker 02/1999   Pain in left knee 03/06/2018   Sinoatrial node dysfunction [facility])     Past Surgical History:  Procedure Laterality Date   APPLICATION OF WOUND VAC Left 09/22/2020   Procedure: APPLICATION OF WOUND VAC;  Surgeon: Dereck Leep, MD;  Location: [facility] ORS;  Service: Orthopedics;  Laterality: Left;   ARTHROSCOPIC REPAIR ACL Left 1996   BILATERAL CARPAL TUNNEL RELEASE Bilateral 2007   CARDIAC ELECTROPHYSIOLOGY STUDY AND ABLATION  1996-1999   x4   HYSTEROSCOPY  2004   essure placement   INSERT / REPLACE / REMOVE PACEMAKER  02/1999   SA node blocked from scar tissue formed after 4th ablation   KNEE ARTHROPLASTY Left 09/22/2020   Procedure: COMPUTER ASSISTED TOTAL KNEE ARTHROPLASTY;  Surgeon: Dereck Leep, MD;  Location: [facility] ORS;  Service: Orthopedics;  Laterality: Left;   LIPOMA EXCISION  2001   left scapula   PACEMAKER IMPLANT  08/07/2021   [employer]   TONSILLECTOMY AND ADENOIDECTOMY  1992    Current Meds  Medication Sig   amphetamine-dextroamphetamine (ADDERALL) 20 MG tablet Take 20 mg by mouth 2 (two) times daily as needed (attention/focus).   cholecalciferol (VITAMIN D) 25  MCG (1000 UNIT) tablet Take 3,000-5,000 Units by mouth daily.   diclofenac Sodium (VOLTAREN) 1 % GEL Apply topically as needed.   flecainide (TAMBOCOR) 50 MG tablet Take 1/2 (one-half) tablet by mouth twice daily   lidocaine-prilocaine (EMLA) cream Apply 1 application topically 2 (two) times daily as needed (plantar fasciitis pain.).   metoprolol succinate (TOPROL-XL) 25 MG 24 hr tablet Take 1 tablet (25 mg total) by mouth daily.   VYVANSE 70 MG capsule Take 70 mg by mouth daily.   Zinc 50 MG TABS Take 50 mg by mouth daily.    Allergies  Allergen Reactions   Iodinated Contrast Media Itching and Other (See Comments)    Itchy throat, sneezing,abdominal pain    Other Other (See  Comments)    Vicryl sutures- dishiscence   Tape Rash    Paper tape   Wound Dressing Adhesive Rash      Review of Systems negative except from HPI and PMH  Physical Exam BP 100/70 (BP Location: Left Arm, Patient Position: Sitting, Cuff Size: Normal)   Pulse 61   Ht 5\' 1"  (1.549 m)   Wt 162 lb 6 oz (73.7 kg)   SpO2 96%   BMI 30.68 kg/m  Well developed and well nourished in no acute distress HENT normal Neck supple with JVP-flat Clear Device pocket well healed; without hematoma or erythema.  There is no tethering  Regular rate and rhythm, no  gallop No  murmur Abd-soft with active BS No Clubbing cyanosis  edema Skin-warm and dry A & Oriented  Grossly normal sensory and motor function  ECG AV pacing with a QRS duration of 178  Device function is normal. Programming changes were made to the rate response algorithm, decreasing from 12--9 on both minute ventilation as well as accelerometer, I was somewhat surprised that the minute ventilation change had the bigger impact. See Paceart for details    CrCl cannot be calculated (Patient's most recent lab result is older than the maximum 21 days allowed.).   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  Ventricular threshold seems to be very somewhat variable.  We decreased the outputs a little bit.  Longevity about 10 years.  In the absence of ongoing symptoms, we will not repeat PET scanning at this time  Atrial arrhythmias seem to be quiescient on flecainide we will continue 25 mg twice daily     Current medicines are reviewed at length with the  patient today .  The patient does not  have concerns regarding medicines.

## 2022-09-14 NOTE — Patient Instructions (Signed)
Medication Instructions:  Your physician recommends that you continue on your current medications as directed. Please refer to the Current Medication list given to you today.  *If you need a refill on your cardiac medications before your next appointment, please call your pharmacy*   Lab Work: None ordered If you have labs (blood work) drawn today and your tests are completely normal, you will receive your results only by: [place] (if you have MyChart) OR A paper copy in the mail If you have any lab test that is abnormal or we need to change your treatment, we will call you to review the results.   Testing/Procedures: None ordered   Follow-Up: At [facility], you and your health needs are our priority.  As part of our continuing mission to provide you with exceptional heart care, we have created designated Provider Care Teams.  These Care Teams include your primary Cardiologist (physician) and Advanced Practice Providers (APPs -  Physician Assistants and Nurse Practitioners) who all work together to provide you with the care you need, when you need it.  We recommend signing up for the patient portal called "MyChart".  Sign up information is provided on this After Visit Summary.  MyChart is used to connect with patients for Virtual Visits (Telemedicine).  Patients are able to view lab/test results, encounter notes, upcoming appointments, etc.  Non-urgent messages can be sent to your provider as well.   To learn more about what you can do with MyChart, go to [email or web address].    Your next appointment:   12 month(s)  Provider:   Virl Axe, MD   Other Instructions None

## 2022-09-28 ENCOUNTER — Other Ambulatory Visit: Payer: Self-pay | Admitting: Internal Medicine

## 2022-10-13 ENCOUNTER — Encounter: Payer: Self-pay | Admitting: Internal Medicine

## 2022-11-18 ENCOUNTER — Ambulatory Visit (INDEPENDENT_AMBULATORY_CARE_PROVIDER_SITE_OTHER): Payer: Commercial Managed Care - PPO

## 2022-11-18 DIAGNOSIS — I442 Atrioventricular block, complete: Secondary | ICD-10-CM

## 2022-11-18 LAB — CUP PACEART REMOTE DEVICE CHECK
Battery Remaining Longevity: 120 mo
Battery Remaining Percentage: 100 %
Brady Statistic RA Percent Paced: 97 %
Brady Statistic RV Percent Paced: 100 %
Date Time Interrogation Session: 20240411011100
Implantable Lead Connection Status: 753985
Implantable Lead Connection Status: 753985
Implantable Lead Implant Date: 20150114
Implantable Lead Implant Date: 20150114
Implantable Lead Location: 753859
Implantable Lead Location: 753860
Implantable Lead Model: 5068
Implantable Pulse Generator Implant Date: 20221230
Lead Channel Impedance Value: 537 Ohm
Lead Channel Impedance Value: 565 Ohm
Lead Channel Setting Pacing Amplitude: 2.5 V
Lead Channel Setting Pacing Amplitude: 2.5 V
Lead Channel Setting Pacing Pulse Width: 1.2 ms
Lead Channel Setting Sensing Sensitivity: 2.5 mV
Pulse Gen Serial Number: 567309
Zone Setting Status: 755011

## 2022-11-22 ENCOUNTER — Other Ambulatory Visit: Payer: Self-pay | Admitting: Family Medicine

## 2022-11-22 DIAGNOSIS — Z1231 Encounter for screening mammogram for malignant neoplasm of breast: Secondary | ICD-10-CM

## 2022-12-04 ENCOUNTER — Encounter: Payer: Self-pay | Admitting: Internal Medicine

## 2022-12-06 NOTE — Telephone Encounter (Signed)
Short episodes do not require anticoagulation Thanks SK

## 2022-12-20 ENCOUNTER — Ambulatory Visit
Admission: RE | Admit: 2022-12-20 | Discharge: 2022-12-20 | Disposition: A | Discharge status: Home / Self Care | Payer: Commercial Managed Care - PPO | Source: Ambulatory Visit | Attending: Family Medicine | Admitting: Family Medicine

## 2022-12-20 DIAGNOSIS — Z1231 Encounter for screening mammogram for malignant neoplasm of breast: Secondary | ICD-10-CM | POA: Diagnosis present

## 2022-12-23 NOTE — Progress Notes (Signed)
Remote pacemaker transmission.   

## 2023-02-17 ENCOUNTER — Ambulatory Visit (INDEPENDENT_AMBULATORY_CARE_PROVIDER_SITE_OTHER): Payer: Commercial Managed Care - PPO

## 2023-02-17 DIAGNOSIS — I442 Atrioventricular block, complete: Secondary | ICD-10-CM | POA: Diagnosis not present

## 2023-02-17 LAB — CUP PACEART REMOTE DEVICE CHECK
Battery Remaining Longevity: 114 mo
Battery Remaining Percentage: 100 %
Brady Statistic RA Percent Paced: 98 %
Brady Statistic RV Percent Paced: 100 %
Date Time Interrogation Session: 20240711011100
Implantable Lead Connection Status: 753985
Implantable Lead Connection Status: 753985
Implantable Lead Implant Date: 20150114
Implantable Lead Implant Date: 20150114
Implantable Lead Location: 753859
Implantable Lead Location: 753860
Implantable Lead Model: 5068
Implantable Pulse Generator Implant Date: 20221230
Lead Channel Impedance Value: 537 Ohm
Lead Channel Impedance Value: 551 Ohm
Lead Channel Setting Pacing Amplitude: 2.5 V
Lead Channel Setting Pacing Amplitude: 2.5 V
Lead Channel Setting Pacing Pulse Width: 1.2 ms
Lead Channel Setting Sensing Sensitivity: 2.5 mV
Pulse Gen Serial Number: 567309
Zone Setting Status: 755011

## 2023-03-07 NOTE — Progress Notes (Signed)
Remote pacemaker transmission.   

## 2023-03-11 IMAGING — CT CT LEAD EXTRACTION W/CM
1 of 5 series · 8 of 16 positions shown, 10 images · IV contrast (omnipaque)
Comparison: No priors.
COMPARISON: No priors.

Addendum:
CLINICAL DATA: 44-year-old female with history of pacemaker with
lead failure. Preprocedural study prior to potential lead
extraction.

EXAM:
CT CHEST WITH CONTRAST
TECHNIQUE: Multidetector CT imaging of the chest was performed during
intravenous contrast administration.
CONTRAST:  100mL OMNIPAQUE IOHEXOL 300 MG/ML  SOLN
CLINICAL DATA: Cardiovascular Implantable Electronic Device (CIED)
lead extraction Planning
Gated Cardiac CT cardiac
TECHNIQUE: A 90 kV prospective scan was performed 70 seconds after contrast
injection to optimize venous structures. The field of view included
the subclavian veins to the heart. Gantry rotation speed was 250
msecs and collimation was 0.6 mm. No beta blockade and 0.8 mg of sl
NTG was given. The 3D data set was reconstructed in 5% intervals of
the 55-75 % of the R-R cycle. Diastolic phases were analyzed on a
dedicated work station using MPR, MIP and VRT modes. The patient
received 100 cc of contrast.

[Series 3: lead ext · axial · 0.78mm/px · z∈[-232,-18]mm · 8 of 688 slices shown, 10 images]
[im 77/688  soft-tissue]
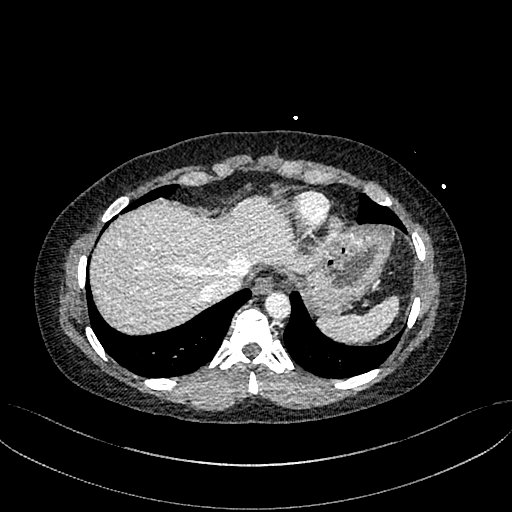
[im 77/688  bone]
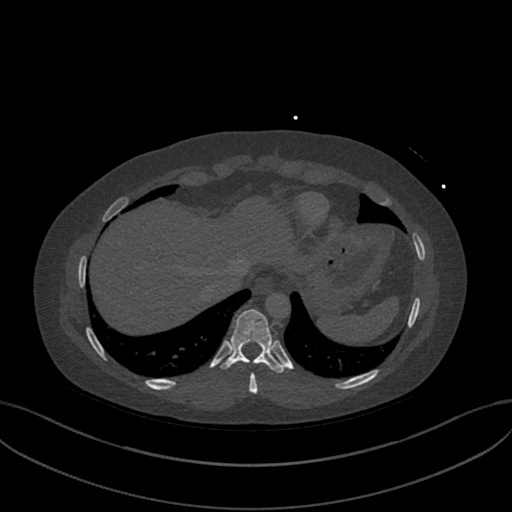
[im 153/688  soft-tissue]
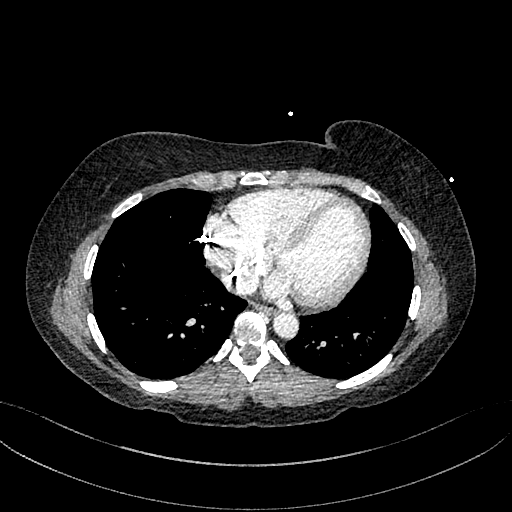
[im 230/688  soft-tissue]
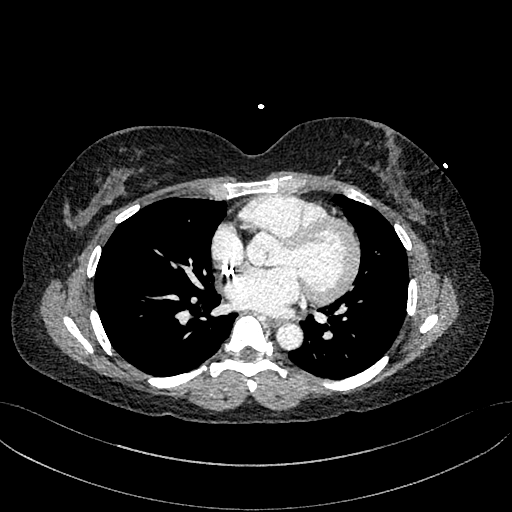
[im 306/688  soft-tissue]
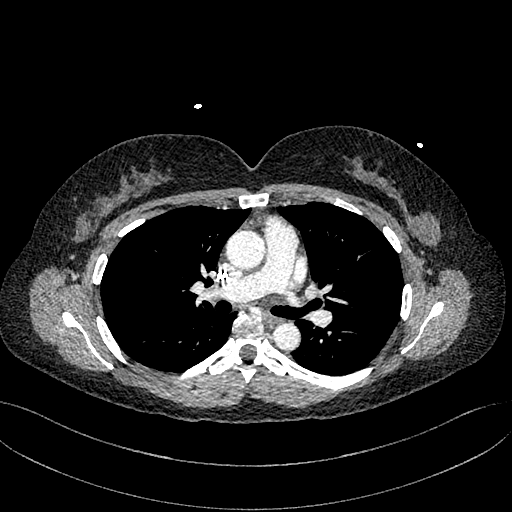
[im 382/688  soft-tissue]
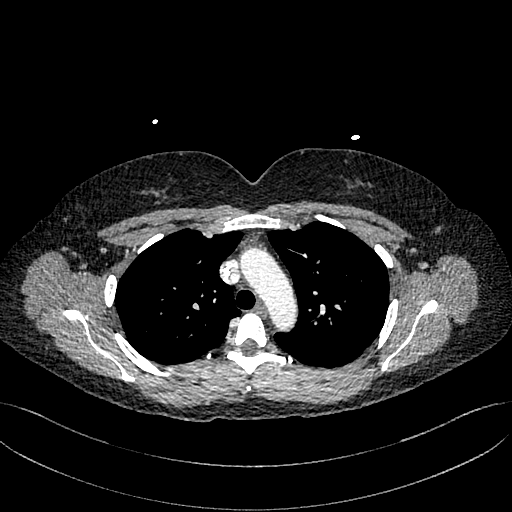
[im 382/688  bone]
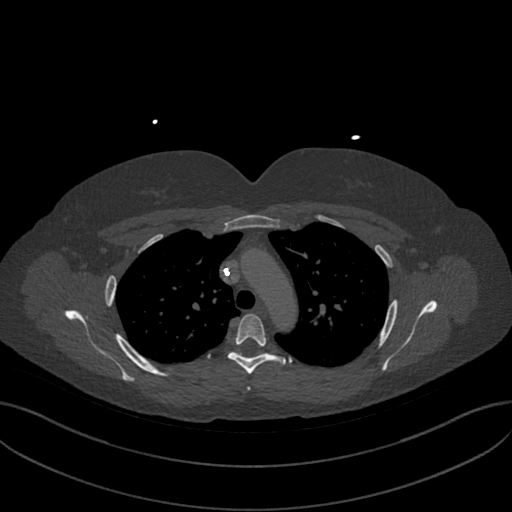
[im 459/688  soft-tissue]
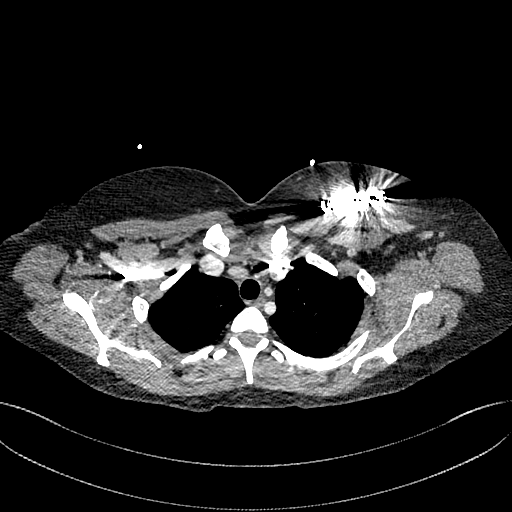
[im 535/688  soft-tissue]
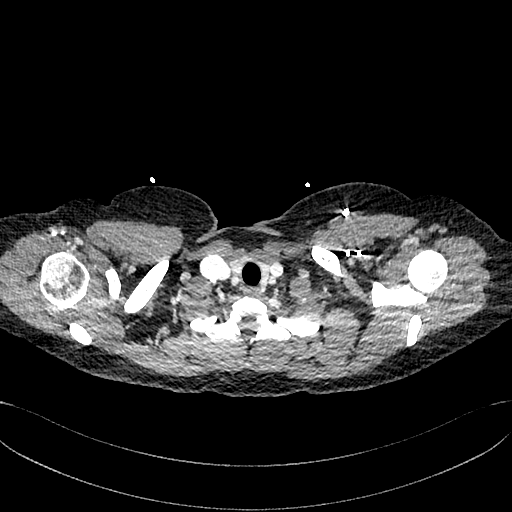
[im 611/688  soft-tissue]
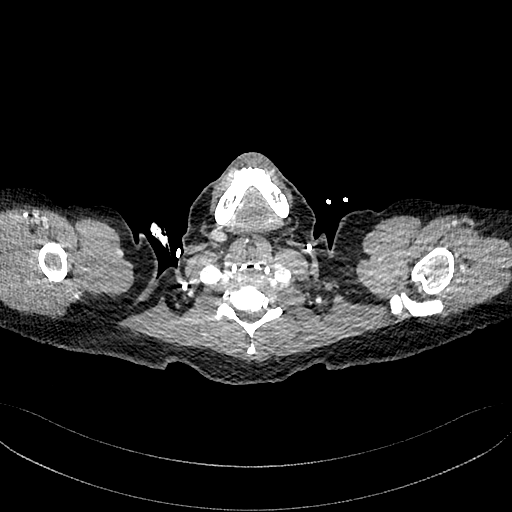

[8 of 16 positions shown; findings below may reference images not displayed]

FINDINGS: Cardiovascular: Heart size is normal. There is no significant
pericardial fluid, thickening or pericardial calcification. No
atherosclerotic calcifications in the thoracic aorta or the coronary
arteries. Left-sided pacemaker device in place with lead tips
terminating in the right atrium and right ventricle (right
ventricular lead appears well separated from the free wall of the
right ventricle, but does appear to contact the inferior wall of the
right ventricle).

Findings Specific to Lead Extraction:

Venous stenoses - None

Lead thrombus - There are faint calcifications associated with both
leads in the superior vena cava (best appreciated on axial image 70
of series 5), as well as calcifications associated with the right
ventricular lead near the superior cavoatrial junction (axial image
94 of series 5), which likely reflect partially calcified chronic
lead associated thrombus.

Lead fractures - None identified.

Mediastinum/Nodes: No pathologically enlarged mediastinal or hilar
lymph nodes. Esophagus is unremarkable in appearance. No axillary
lymphadenopathy.

Lungs/Pleura: No suspicious appearing pulmonary nodules or masses
are noted. No acute consolidative airspace disease. No pleural
effusions.

Upper Abdomen: Subcentimeter low-attenuation lesion in segment 8 of
the liver, too small to characterize, but statistically likely to
represent a tiny cyst.

Musculoskeletal: There are no aggressive appearing lytic or blastic
lesions noted in the visualized portions of the skeleton.
IMPRESSION: 1. Probable small partially calcified chronic lead associated
thrombi in the superior vena cava (associated with both leads) and
right atrium (associated with the right ventricular lead).
2. No venous stenoses and no definite lead fractures identified.
3. Additional incidental findings, as above.
FINDINGS: Image quality: excellent.

Noise artifact is: limited.

CIED:

Course: There is a left side CIED. There are 2 CIED leads present
that course trough the left subclavian vein, left brachiocephalic
vein, and superior vena cava. There is a single lead that terminates
in the right atrium, and a single lead that terminates in the right
ventricle. The relationship between CIED leads is further detailed
below:

Superior Vena Cava: There are 2 CIED that course through the SVC.
The CIED that terminates in the RA touches the wall of the SVC for
>= 1 cm laterally. There appears to be calcified material attached
to the RA lead in the SVC. The CIED lead that terminates in the RV
is located centrally within the lumen. The RV lead is adherent to
the SVC wall >=1 cm at the junction of the SVC/RA wall. There is
calcified material attached to the lead in this region of the SVC/RA
junction.

Right Atrium: There is a CIED lead that is embedded within the RA
wall. The CIED does not terminate beyond the RA wall.

Right Ventricle: There is a CIED lead the is embedded within the RV
apex. The CIED lead does not terminate beyond the RV wall.

Lead Fracture: No CIED lead fractures were noted.

Thrombus: Calcified material is noted on the RA lead in the SVC and
on the RV lead at the junction of the SVC/RA.

Central Veins: The subclavian vein, brachiocephalic vein, and
superior vena cava are patent without stenoses.

Right Atrium: Right atrial size is within normal limits.

Right Ventricle: The right ventricular cavity is within normal
limits.

Left Atrium: Left atrial size is normal in size with no left atrial
appendage filling defect.

Left Ventricle: The ventricular cavity size is within normal limits.

Pulmonary arteries: Normal in size without proximal filling defect.

Pulmonary veins: Normal pulmonary venous drainage.

Pericardium: Normal thickness with no significant effusion or
calcium present.

Aorta: Normal caliber with no significant disease.

Extra-cardiac findings: See attached radiology report for
non-cardiac structures.
IMPRESSION: 1. The RA lead touches the wall of the SVC >1 cm laterally. There is
calcified material which could represent calcified thrombus given
the age of the lead.

2. The RV lead courses centrally in the SVC, but does touch the SVC
> 1 cm at the junction of the SVC/RA. There is calcified material
attached to the lead in this region which could represent calcified
thrombus given the age of the lead.

3. The RA/RV leads terminate in the RA wall and RV apex without
high-risk features.

4. No lead fractures detected.

5. Overall, these findings are moderate risk for CIED lead
extraction based on the course of the leads in the SVC and SVC/RA
junction. Also, note the calcified thrombus in the SVC/RA region.

*** End of Addendum ***
FINDINGS: Cardiovascular: Heart size is normal. There is no significant
pericardial fluid, thickening or pericardial calcification. No
atherosclerotic calcifications in the thoracic aorta or the coronary
arteries. Left-sided pacemaker device in place with lead tips
terminating in the right atrium and right ventricle (right
ventricular lead appears well separated from the free wall of the
right ventricle, but does appear to contact the inferior wall of the
right ventricle).

Findings Specific to Lead Extraction:

Venous stenoses - None

Lead thrombus - There are faint calcifications associated with both
leads in the superior vena cava (best appreciated on axial image 70
of series 5), as well as calcifications associated with the right
ventricular lead near the superior cavoatrial junction (axial image
94 of series 5), which likely reflect partially calcified chronic
lead associated thrombus.

Lead fractures - None identified.

Mediastinum/Nodes: No pathologically enlarged mediastinal or hilar
lymph nodes. Esophagus is unremarkable in appearance. No axillary
lymphadenopathy.

Lungs/Pleura: No suspicious appearing pulmonary nodules or masses
are noted. No acute consolidative airspace disease. No pleural
effusions.

Upper Abdomen: Subcentimeter low-attenuation lesion in segment 8 of
the liver, too small to characterize, but statistically likely to
represent a tiny cyst.

Musculoskeletal: There are no aggressive appearing lytic or blastic
lesions noted in the visualized portions of the skeleton.
IMPRESSION: 1. Probable small partially calcified chronic lead associated
thrombi in the superior vena cava (associated with both leads) and
right atrium (associated with the right ventricular lead).
2. No venous stenoses and no definite lead fractures identified.
3. Additional incidental findings, as above.

## 2023-05-19 ENCOUNTER — Ambulatory Visit (INDEPENDENT_AMBULATORY_CARE_PROVIDER_SITE_OTHER): Payer: Commercial Managed Care - PPO

## 2023-05-19 DIAGNOSIS — I442 Atrioventricular block, complete: Secondary | ICD-10-CM

## 2023-05-19 LAB — CUP PACEART REMOTE DEVICE CHECK
Battery Remaining Longevity: 114 mo
Battery Remaining Percentage: 100 %
Brady Statistic RA Percent Paced: 98 %
Brady Statistic RV Percent Paced: 100 %
Date Time Interrogation Session: 20241010011100
Implantable Lead Connection Status: 753985
Implantable Lead Connection Status: 753985
Implantable Lead Implant Date: 20150114
Implantable Lead Implant Date: 20150114
Implantable Lead Location: 753859
Implantable Lead Location: 753860
Implantable Lead Model: 5068
Implantable Pulse Generator Implant Date: 20221230
Lead Channel Impedance Value: 523 Ohm
Lead Channel Impedance Value: 532 Ohm
Lead Channel Setting Pacing Amplitude: 2.5 V
Lead Channel Setting Pacing Amplitude: 2.5 V
Lead Channel Setting Pacing Pulse Width: 1.2 ms
Lead Channel Setting Sensing Sensitivity: 2.5 mV
Pulse Gen Serial Number: 567309
Zone Setting Status: 755011

## 2023-06-02 NOTE — Progress Notes (Signed)
Remote pacemaker transmission.   

## 2023-08-18 ENCOUNTER — Ambulatory Visit (INDEPENDENT_AMBULATORY_CARE_PROVIDER_SITE_OTHER): Payer: 59

## 2023-08-18 DIAGNOSIS — I442 Atrioventricular block, complete: Secondary | ICD-10-CM

## 2023-08-18 LAB — CUP PACEART REMOTE DEVICE CHECK
Battery Remaining Longevity: 114 mo
Battery Remaining Percentage: 100 %
Brady Statistic RA Percent Paced: 98 %
Brady Statistic RV Percent Paced: 100 %
Date Time Interrogation Session: 20250109011200
Implantable Lead Connection Status: 753985
Implantable Lead Connection Status: 753985
Implantable Lead Implant Date: 20150114
Implantable Lead Implant Date: 20150114
Implantable Lead Location: 753859
Implantable Lead Location: 753860
Implantable Lead Model: 5068
Implantable Pulse Generator Implant Date: 20221230
Lead Channel Impedance Value: 524 Ohm
Lead Channel Impedance Value: 545 Ohm
Lead Channel Setting Pacing Amplitude: 2.5 V
Lead Channel Setting Pacing Amplitude: 2.5 V
Lead Channel Setting Pacing Pulse Width: 1.2 ms
Lead Channel Setting Sensing Sensitivity: 2.5 mV
Pulse Gen Serial Number: 567309
Zone Setting Status: 755011

## 2023-09-27 NOTE — Progress Notes (Signed)
 Remote pacemaker transmission.

## 2023-09-28 ENCOUNTER — Other Ambulatory Visit: Payer: Self-pay | Admitting: Internal Medicine

## 2023-10-05 ENCOUNTER — Encounter: Payer: Self-pay | Admitting: Internal Medicine

## 2023-10-27 ENCOUNTER — Other Ambulatory Visit: Payer: Self-pay | Admitting: Internal Medicine

## 2023-11-16 ENCOUNTER — Other Ambulatory Visit: Payer: Self-pay | Admitting: Internal Medicine

## 2023-11-17 ENCOUNTER — Ambulatory Visit (INDEPENDENT_AMBULATORY_CARE_PROVIDER_SITE_OTHER): Payer: 59

## 2023-11-17 DIAGNOSIS — I442 Atrioventricular block, complete: Secondary | ICD-10-CM

## 2023-11-18 LAB — CUP PACEART REMOTE DEVICE CHECK
Battery Remaining Longevity: 108 mo
Battery Remaining Percentage: 100 %
Brady Statistic RA Percent Paced: 98 %
Brady Statistic RV Percent Paced: 100 %
Date Time Interrogation Session: 20250410012400
Implantable Lead Connection Status: 753985
Implantable Lead Connection Status: 753985
Implantable Lead Implant Date: 20150114
Implantable Lead Implant Date: 20150114
Implantable Lead Location: 753859
Implantable Lead Location: 753860
Implantable Lead Model: 5068
Implantable Pulse Generator Implant Date: 20221230
Lead Channel Impedance Value: 517 Ohm
Lead Channel Impedance Value: 558 Ohm
Lead Channel Setting Pacing Amplitude: 2.5 V
Lead Channel Setting Pacing Amplitude: 2.5 V
Lead Channel Setting Pacing Pulse Width: 1.2 ms
Lead Channel Setting Sensing Sensitivity: 2.5 mV
Pulse Gen Serial Number: 567309
Zone Setting Status: 755011

## 2023-11-19 ENCOUNTER — Encounter: Payer: Self-pay | Admitting: Internal Medicine

## 2023-11-30 ENCOUNTER — Other Ambulatory Visit: Payer: Self-pay | Admitting: Internal Medicine

## 2023-11-30 MED ORDER — METOPROLOL SUCCINATE ER 25 MG PO TB24: 25.0000 mg | ORAL_TABLET | Freq: Every day | ORAL | 0 refills | Status: DC

## 2023-11-30 NOTE — Telephone Encounter (Signed)
*  STAT* If patient is at the pharmacy, call can be transferred to refill team.   1. Which medications need to be refilled? (please list name of each medication and dose if known) Metoprolol    2. Would you like to learn more about the convenience, safety, & potential cost savings by using the [facility]? no   3. Are you open to using the [facility]  NO.   4. Which pharmacy/location (including street and city if local pharmacy) is medication to be sent to?[facility] [place]   5. Do they need a 30 day or 90 day supply? 30  Patient has an appointment with Emma Riddle, NP on 5/15

## 2023-12-21 NOTE — Progress Notes (Unsigned)
 Electrophysiology Clinic Note    Date:  12/23/2023  Patient ID:  Emma Long, Emma Long [birth year], MRN [id number] PCP:  [facility]  Cardiologist:  Constancia Delton, MD Electrophysiologist: Richardo Chandler, MD    Discussed the use of AI scribe software for clinical note transcription with the patient, who gave verbal consent to proceed.   Patient Profile    Chief Complaint: PPM follow-up  History of Present Illness: Emma Long is a 48 y.o. 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   Remote history of SVT s/p ablations at [facility] in 1990s. Has been maintained on flecainide .   She has extensive device history, initially implanted MDT in 2000, s/p gen change 2007 to [facility]. 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  She last saw Dr. Rodolfo Clan 09/2022 and was overall doing well.   On follow-up today, she is feeling very well. She adjusted her diet to a Keto-Vore diet and has lost about 20lbs and feels much better. She noticed that she had no palpitations and self-stopped her flecainide   several months ago without recurrence of palpitations.   She denies chest pain, chest pressure, SOB    Arrhythmia/Device History Bos Sci dual chamber PPM, - medtronic RA, LV lead  - gen changes 2007, 2015, 2022  AAD -  Flecainide      ROS:  Please see the history of present illness. All other systems are reviewed and otherwise negative.    Physical Exam    VS:  BP 110/72   Pulse 62   Ht 5\' 1"  (1.549 m)   Wt 154 lb 9.6 oz (70.1 kg)   SpO2 99%   BMI 29.21 kg/m  BMI: Body mass index is 29.21 kg/m.  Wt Readings from Last 3 Encounters:  12/22/23 154 lb 9.6 oz (70.1 kg)  09/14/22 162 lb 6 oz (73.7 kg)  06/16/22 159 lb (72.1 kg)     GEN- The patient is well appearing, alert and oriented x 3 today.   Lungs- Clear  to ausculation bilaterally, normal work of breathing.  Heart- Regular rate and rhythm, no murmurs, rubs or gallops Extremities- No peripheral edema, warm, dry Skin-  device pocket well-healed, no tethering   Device interrogation done today and reviewed by myself:  Battery 9 years Lead thresholds, impedence, sensing stable  Dependent down to VVI 40 No episodes High AP and VP No changes made today   Studies Reviewed   Previous EP, cardiology notes.    EKG is ordered. Personal review of EKG from today shows:    EKG Interpretation Date/Time:  Thursday Dec 22 2023 13:23:03 EDT Ventricular Rate:  62 PR Interval:  242 QRS Duration:  190 QT Interval:  490 QTC Calculation: 497 R Axis:   -57  Text Interpretation: AV dual-paced rhythm with prolonged AV conduction When compared with ECG of 05-Sep-2020 11:42, Vent. rate has decreased BY   8 BPM Confirmed by Bailley [place] [phone]) on 12/22/2023 1:26:17 PM    Pet myocardial CT, 02/02/2021 (via care everywhere)  FINAL COMMENTS   METABOLISM    1- Cardiac PET/CT Metabolic Study with F-18 FDG 14.32 mCi given IV and Scanned on PET Discovery MI: There are multiple segments/areas with increased metabolic activity particularlrly in the lateral wall. This is consistent with active inflamatory process such as Sarcoidosis or Myocardial Inflamation from  other etiologies. The total area of inflammation involves about 37%   of the LV. No evidence for abnormal metabolism in the RV free wall.    PERFUSION/FUNCTION    1- Gated Cardiac PET/CT Rest Myocardial Perfusion Study with Rb-82 : Reveals Abnormal Perfusion to The Myocardium Involving about Less Tha 5% of the LV. This is Consistent With Small Area of "Scar/Fibrosis" From Prevous Inflamatory Process that is Now Resolved.. Normal Left Ventricular Ejection Fraction at Rest as Well as Left Ventricular Volumes. Normal Left Ventricular Systolic Function, Wall Thickening and Wall Motion. No Evidence of Increased  Tracer Uptake Rb-82 in The Right Ventricular Free Wall to Suggest RVH.    2- Cardiac CT Viewer: No Evidence of Coronary Artery Calcifications.    3- Limited View of Lower Chest and Upper Abdomen PET And Non-Diagnostic CT: No Definite Evidence of Extra Cardiac Areas in The Chest of Hypermetabolism to Suggest Sarcoidosis/Inflammation on The Limited [facility].      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   The patient does not have concerns regarding her medicines.  The following changes were made today:  none  Labs/ tests ordered today include:  Orders Placed This Encounter  Procedures   EKG 12-Lead     Disposition: Follow up with Dr. Rodolfo Clan or EP APP in 12 months ( or Dr. Marven Slimmer when Dr. Arland Bellis)   Signed, Adaline Holly, NP  12/23/23  11:14 AM  Electrophysiology [facility]

## 2023-12-22 ENCOUNTER — Encounter: Payer: Self-pay | Admitting: Cardiology

## 2023-12-22 ENCOUNTER — Ambulatory Visit: Attending: Cardiology | Admitting: Cardiology

## 2023-12-22 ENCOUNTER — Encounter: Admitting: Internal Medicine

## 2023-12-22 VITALS — BP 110/72 | HR 62 | Ht 61.0 in | Wt 154.6 lb

## 2023-12-22 DIAGNOSIS — Z950 Presence of cardiac pacemaker: Secondary | ICD-10-CM | POA: Diagnosis not present

## 2023-12-22 DIAGNOSIS — I495 Sick sinus syndrome: Secondary | ICD-10-CM

## 2023-12-22 DIAGNOSIS — I442 Atrioventricular block, complete: Secondary | ICD-10-CM

## 2023-12-22 NOTE — Patient Instructions (Signed)
 Medication Instructions:  The current medical regimen is effective;  continue present plan and medications as directed. Please refer to the Current Medication list given to you today.   *If you need a refill on your cardiac medications before your next appointment, please call your pharmacy*  Follow-Up: At [facility], you and your health needs are our priority.  As part of our continuing mission to provide you with exceptional heart care, our providers are all part of one team.  This team includes your primary Cardiologist (physician) and Advanced Practice Providers or APPs (Physician Assistants and Nurse Practitioners) who all work together to provide you with the care you need, when you need it.  Your next appointment:   12 month(s)  Provider:   Steffanie Dunn, MD or Sherie Don, NP    We recommend signing up for the patient portal called "MyChart".  Sign up information is provided on this After Visit Summary.  MyChart is used to connect with patients for Virtual Visits (Telemedicine).  Patients are able to view lab/test results, encounter notes, upcoming appointments, etc.  Non-urgent messages can be sent to your provider as well.   To learn more about what you can do with MyChart, go to [email or web address].

## 2023-12-26 NOTE — Addendum Note (Signed)
 Addended by: Lott Rouleau A on: 12/26/2023 01:25 PM   Modules accepted: Orders

## 2023-12-26 NOTE — Progress Notes (Signed)
 Remote pacemaker transmission.

## 2024-01-08 ENCOUNTER — Other Ambulatory Visit: Payer: Self-pay | Admitting: Internal Medicine

## 2024-02-16 ENCOUNTER — Ambulatory Visit (INDEPENDENT_AMBULATORY_CARE_PROVIDER_SITE_OTHER): Payer: 59

## 2024-02-16 DIAGNOSIS — I495 Sick sinus syndrome: Secondary | ICD-10-CM | POA: Diagnosis not present

## 2024-02-17 LAB — CUP PACEART REMOTE DEVICE CHECK
Battery Remaining Longevity: 108 mo
Battery Remaining Percentage: 100 %
Brady Statistic RA Percent Paced: 98 %
Brady Statistic RV Percent Paced: 100 %
Date Time Interrogation Session: 20250710011200
Implantable Lead Connection Status: 753985
Implantable Lead Connection Status: 753985
Implantable Lead Implant Date: 20150114
Implantable Lead Implant Date: 20150114
Implantable Lead Location: 753859
Implantable Lead Location: 753860
Implantable Lead Model: 5068
Implantable Pulse Generator Implant Date: 20221230
Lead Channel Impedance Value: 507 Ohm
Lead Channel Impedance Value: 529 Ohm
Lead Channel Setting Pacing Amplitude: 2.5 V
Lead Channel Setting Pacing Amplitude: 2.5 V
Lead Channel Setting Pacing Pulse Width: 1.2 ms
Lead Channel Setting Sensing Sensitivity: 2.5 mV
Pulse Gen Serial Number: 567309
Zone Setting Status: 755011

## 2024-02-18 ENCOUNTER — Ambulatory Visit: Payer: Self-pay | Admitting: Cardiology

## 2024-02-24 ENCOUNTER — Telehealth: Payer: Self-pay

## 2024-02-24 NOTE — Telephone Encounter (Signed)
 Pt called in stating that her remotes are not covered by her insurance and she is getting 200$ bills for it and she was told that it would be covered. Pt states she has unplugged her monitor and will not be doing remotes.

## 2024-02-27 NOTE — Telephone Encounter (Signed)
 Transferring to billing pool.

## 2024-05-17 NOTE — Progress Notes (Signed)
 Remote PPM Transmission

## 2024-05-25 ENCOUNTER — Ambulatory Visit: Payer: Self-pay | Admitting: Cardiology

## 2024-05-25 LAB — CUP PACEART INCLINIC DEVICE CHECK
Date Time Interrogation Session: 20250515114934
Implantable Lead Connection Status: 753985
Implantable Lead Connection Status: 753985
Implantable Lead Implant Date: 20150114
Implantable Lead Implant Date: 20150114
Implantable Lead Location: 753859
Implantable Lead Location: 753860
Implantable Lead Model: 5068
Implantable Pulse Generator Implant Date: 20221230
Pulse Gen Serial Number: 567309

## 2024-05-30 NOTE — Telephone Encounter (Signed)
 Is it ok to cancel her remotes and have her do in office checks yearly?

## 2024-07-15 NOTE — Progress Notes (Unsigned)
 Electrophysiology Clinic Note    Date:  07/16/2024  Patient ID:  Emma Long, Emma Long [birth year], MRN [id number] PCP:  [facility]  Cardiologist:  Redell Cave, MD  Electrophysiologist:  Fonda Kitty, MD  Electrophysiology APP:  Tonie Elsey, NP     Discussed the use of AI scribe software for clinical note transcription with the patient, who gave verbal consent to proceed.   Patient Profile    Chief Complaint: PPM follow-up  History of Present Illness: Emma Long is a 48 y.o. 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 Fernande) for routine electrophysiology followup.   Remote history of SVT s/p ablations at [facility] in 1990s. Has been maintained on flecainide .    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 Most recently gen change to a [employer]. She was eval'd by Dr. Wonda [facility]) re: sarcoid and felt not to have cardiac sarcoid.   I last saw her 12/2023 at which time she had lost considerable amount of weight via dietary changes with improvement in palpitations and self-stopped flecainide . During visit she was doing very well without complaints.   On follow-up today, she is doing well without cardiac complaints. She denies chest pain, chest pressure, palpitations, edema. She remains active - currently remodeling her home - without any limitations. She has very brief palpitation episodes that do not affect her quality of life or day-to-day activities.        Arrhythmia/Device History Bos Sci dual chamber PPM, - medtronic RA, LV lead  - gen changes 2007, 2015, 2022  AAD -  Flecainide  - stopped    ROS:  Please see the history of present illness. All other systems are reviewed and otherwise negative.    Physical Exam    VS:  BP 116/68 (BP Location:  Left Arm, Patient Position: Sitting, Cuff Size: Normal)   Pulse 60   Ht 5' 1 (1.549 m)   Wt 147 lb (66.7 kg)   SpO2 97%   BMI 27.78 kg/m  BMI: Body mass index is 27.78 kg/m.           Wt Readings from Last 3 Encounters:  07/16/24 147 lb (66.7 kg)  12/22/23 154 lb 9.6 oz (70.1 kg)  09/14/22 162 lb 6 oz (73.7 kg)     GEN- The patient is well appearing, alert and oriented x 3 today.   Lungs- Clear to ausculation bilaterally, normal work of breathing.  Heart- Regular rate and rhythm, no murmurs, rubs or gallops Extremities- No peripheral edema, warm, dry Skin-  device pocket well-healed, no tethering   Device interrogation done today and reviewed by myself:  Battery 8.5 years Lead thresholds, impedence stable.  Atrially and ventricularly dependent down to 40bpm Brief Atrial noise episodes Increased atrial sensing to 0.54mV No other changes made today   Studies Reviewed   Previous EP, cardiology notes.    EKG is ordered. Personal review of EKG from today shows:    EKG Interpretation Date/Time:  Monday July 16 2024 10:53:46 EST Ventricular Rate:  60 PR Interval:  248 QRS Duration:  182 QT Interval:  490 QTC Calculation: 490 R Axis:   -62  Text Interpretation: AV dual-paced rhythm with prolonged AV conduction Confirmed by Stellan Vick [phone]) on 07/16/2024 10:58:36 AM    Myocardial  PET CT, 02/02/2021 METABOLISM    1- Cardiac PET/CT Metabolic Study with F-18 FDG 14.32 mCi given IV and Scanned on PET Discovery MI: There are multiple   segments/areas with increased metabolic activity particularlrly in the lateral wall. This is consistent with active inflamatory process such as Sarcoidosis or Myocardial Inflamation from other etiologies. The total area of inflammation involves about 37% of the LV. No evidence for abnormal metabolism in the RV free wall.    PERFUSION/FUNCTION    1- Gated Cardiac PET/CT Rest Myocardial Perfusion Study with Rb-82 : Reveals Abnormal  Perfusion to The Myocardium   Involving about Less Tha 5% of the LV. This is Consistent With Small Area of Scar/Fibrosis From Prevous Inflamatory   Process that is Now Resolved.. Normal Left Ventricular Ejection Fraction at Rest as Well as Left Ventricular Volumes. Normal   Left Ventricular Systolic Function, Wall Thickening and Wall Motion. No Evidence of Increased Tracer Uptake Rb-82 in The   Right Ventricular Free Wall to Suggest RVH.    2- Cardiac CT Viewer: No Evidence of Coronary Artery Calcifications.    3- Limited View of Lower Chest and Upper Abdomen PET And Non-Diagnostic CT: No Definite Evidence of Extra Cardiac Areas in The Chest of Hypermetabolism to Suggest Sarcoidosis/Inflammation on The Limited [employer].   TTE, 07/08/2020  1. Left ventricular ejection fraction, by estimation, is 60 to 65%. Left ventricular ejection fraction by 3D volume is 64 %. The left ventricle has normal function. The left ventricle has no regional wall motion abnormalities. There is mild left ventricular hypertrophy. Left ventricular diastolic parameters were normal.   2. Right ventricular systolic function is normal. The right ventricular size is normal. There is normal pulmonary artery systolic pressure.   3. The mitral valve is normal in structure. No evidence of mitral valve regurgitation.   4. The aortic valve is tricuspid. Aortic valve regurgitation is not visualized.   5. The inferior vena cava is normal in size with greater than 50% respiratory variability, suggesting right atrial pressure of 3 mmHg.    Assessment and Plan     #) SND #) CHB #) PPM in situ Battery good Lead thresholds stable Brief atrial noise episode; increased atrial sensing so as to not lose AV synchrony She does not participate in remote monitoring at this time d/t high cost High VP, AP Will update TTE given high VP        Current medicines are reviewed at length with the patient today.   The patient does  not have concerns regarding her medicines.  The following changes were made today:  none  Labs/ tests ordered today include:  Orders Placed This Encounter  Procedures   EKG 12-Lead   ECHOCARDIOGRAM COMPLETE     Disposition: Follow up with Dr. Kennyth or EP APP in 6 months.  If she resumes remote monitoring, ok to follow-up in 2 years.   Signed, Chantal Needle, NP  07/16/24  11:39 AM  Electrophysiology [facility]

## 2024-07-16 ENCOUNTER — Encounter: Payer: Self-pay | Admitting: Cardiology

## 2024-07-16 ENCOUNTER — Ambulatory Visit: Attending: Cardiology | Admitting: Cardiology

## 2024-07-16 VITALS — BP 116/68 | HR 60 | Ht 61.0 in | Wt 147.0 lb

## 2024-07-16 DIAGNOSIS — Z950 Presence of cardiac pacemaker: Secondary | ICD-10-CM | POA: Diagnosis not present

## 2024-07-16 DIAGNOSIS — I442 Atrioventricular block, complete: Secondary | ICD-10-CM | POA: Diagnosis not present

## 2024-07-16 DIAGNOSIS — I471 Supraventricular tachycardia, unspecified: Secondary | ICD-10-CM | POA: Diagnosis not present

## 2024-07-16 DIAGNOSIS — I495 Sick sinus syndrome: Secondary | ICD-10-CM | POA: Insufficient documentation

## 2024-07-16 LAB — CUP PACEART INCLINIC DEVICE CHECK
Date Time Interrogation Session: 20251208114722
Implantable Lead Connection Status: 753985
Implantable Lead Connection Status: 753985
Implantable Lead Implant Date: 20150114
Implantable Lead Implant Date: 20150114
Implantable Lead Location: 753859
Implantable Lead Location: 753860
Implantable Lead Model: 5068
Implantable Pulse Generator Implant Date: 20221230
Lead Channel Impedance Value: 532 Ohm
Lead Channel Impedance Value: 586 Ohm
Lead Channel Pacing Threshold Amplitude: 0.8 V
Lead Channel Pacing Threshold Amplitude: 0.9 V
Lead Channel Pacing Threshold Pulse Width: 0.6 ms
Lead Channel Pacing Threshold Pulse Width: 1.2 ms
Lead Channel Setting Pacing Amplitude: 2.5 V
Lead Channel Setting Pacing Amplitude: 2.5 V
Lead Channel Setting Pacing Pulse Width: 1.2 ms
Lead Channel Setting Sensing Sensitivity: 2.5 mV
Pulse Gen Serial Number: 567309
Zone Setting Status: 755011

## 2024-07-16 NOTE — Patient Instructions (Signed)
 Medication Instructions:  Your physician recommends that you continue on your current medications as directed. Please refer to the Current Medication list given to you today.  *If you need a refill on your cardiac medications before your next appointment, please call your pharmacy*  Lab Work: No labs ordered today    Testing/Procedures: Your physician has requested that you have an echocardiogram. Echocardiography is a painless test that uses sound waves to create images of your heart. It provides your doctor with information about the size and shape of your heart and how well your heart's chambers and valves are working.   You may receive an ultrasound enhancing agent through an IV if needed to better visualize your heart during the echo. This procedure takes approximately one hour.  There are no restrictions for this procedure.  This will take place at [address] [facility]) [address], [place] [address]  Please note: We ask at that you not bring children with you during ultrasound (echo/ vascular) testing. Due to room size and safety concerns, children are not allowed in the ultrasound rooms during exams. Our front office staff cannot provide observation of children in our lobby area while testing is being conducted. An adult accompanying a patient to their appointment will only be allowed in the ultrasound room at the discretion of the ultrasound technician under special circumstances. We apologize for any inconvenience.   Follow-Up: At [facility], you and your health needs are our priority.  As part of our continuing mission to provide you with exceptional heart care, our providers are all part of one team.  This team includes your primary Cardiologist (physician) and Advanced Practice Providers or APPs (Physician Assistants and Nurse Practitioners) who all work together to provide you with the care you need, when you need it.  Your next appointment:   6  month(s)  Provider:   Suzann Riddle, NP or Dr. Kennyth

## 2024-07-19 ENCOUNTER — Other Ambulatory Visit: Payer: Self-pay | Admitting: Cardiology

## 2024-07-19 DIAGNOSIS — I495 Sick sinus syndrome: Secondary | ICD-10-CM

## 2024-07-19 DIAGNOSIS — I442 Atrioventricular block, complete: Secondary | ICD-10-CM

## 2024-07-19 DIAGNOSIS — I471 Supraventricular tachycardia, unspecified: Secondary | ICD-10-CM

## 2024-07-19 DIAGNOSIS — Z950 Presence of cardiac pacemaker: Secondary | ICD-10-CM

## 2024-07-23 ENCOUNTER — Ambulatory Visit

## 2024-07-23 DIAGNOSIS — Z950 Presence of cardiac pacemaker: Secondary | ICD-10-CM

## 2024-07-23 DIAGNOSIS — I471 Supraventricular tachycardia, unspecified: Secondary | ICD-10-CM

## 2024-07-23 LAB — ECHOCARDIOGRAM COMPLETE
AR max vel: 1.97 cm2
AV Area VTI: 1.93 cm2
AV Area mean vel: 1.87 cm2
AV Mean grad: 5 mmHg
AV Peak grad: 10 mmHg
Ao pk vel: 1.58 m/s
Area-P 1/2: 3.53 cm2
S' Lateral: 2.7 cm

## 2024-07-24 ENCOUNTER — Ambulatory Visit: Payer: Self-pay | Admitting: Cardiology

## 2024-07-26 ENCOUNTER — Ambulatory Visit: Payer: Self-pay | Admitting: Cardiology

## 2024-08-16 ENCOUNTER — Encounter

## 2024-09-12 ENCOUNTER — Ambulatory Visit: Admitting: Cardiology

## 2024-11-15 ENCOUNTER — Encounter

## 2024-12-04 ENCOUNTER — Ambulatory Visit: Admitting: Cardiology

## 2025-02-14 ENCOUNTER — Encounter

## 2025-05-16 ENCOUNTER — Encounter

## 2025-08-15 ENCOUNTER — Encounter

## 2025-11-14 ENCOUNTER — Encounter
